# Patient Record
Sex: Female | Born: 1945 | Race: Black or African American | Hispanic: No | Smoking: Former smoker
Health system: Southern US, Community
[De-identification: ages and names within clinical notes are randomized; demographics above are authoritative.]

## PROBLEM LIST (undated history)

## (undated) DIAGNOSIS — G4730 Sleep apnea, unspecified: Secondary | ICD-10-CM

## (undated) DIAGNOSIS — E1140 Type 2 diabetes mellitus with diabetic neuropathy, unspecified: Secondary | ICD-10-CM

## (undated) DIAGNOSIS — M1990 Unspecified osteoarthritis, unspecified site: Secondary | ICD-10-CM

## (undated) DIAGNOSIS — K219 Gastro-esophageal reflux disease without esophagitis: Secondary | ICD-10-CM

## (undated) DIAGNOSIS — E7800 Pure hypercholesterolemia, unspecified: Secondary | ICD-10-CM

## (undated) DIAGNOSIS — D649 Anemia, unspecified: Secondary | ICD-10-CM

## (undated) DIAGNOSIS — N189 Chronic kidney disease, unspecified: Secondary | ICD-10-CM

## (undated) DIAGNOSIS — Z9289 Personal history of other medical treatment: Secondary | ICD-10-CM

## (undated) DIAGNOSIS — I10 Essential (primary) hypertension: Secondary | ICD-10-CM

## (undated) DIAGNOSIS — I509 Heart failure, unspecified: Secondary | ICD-10-CM

## (undated) DIAGNOSIS — IMO0001 Reserved for inherently not codable concepts without codable children: Secondary | ICD-10-CM

## (undated) HISTORY — PX: EYE SURGERY: SHX253

## (undated) HISTORY — DX: Sleep apnea, unspecified: G47.30

## (undated) HISTORY — DX: Unspecified osteoarthritis, unspecified site: M19.90

## (undated) HISTORY — PX: COLONOSCOPY: SHX174

## (undated) HISTORY — DX: Essential (primary) hypertension: I10

---

## 2000-12-31 HISTORY — PX: TOTAL SHOULDER REPLACEMENT: SUR1217

## 2004-12-31 ENCOUNTER — Encounter (INDEPENDENT_AMBULATORY_CARE_PROVIDER_SITE_OTHER): Payer: Self-pay | Admitting: Nurse Practitioner

## 2005-07-24 ENCOUNTER — Other Ambulatory Visit: Admission: RE | Admit: 2005-07-24 | Discharge: 2005-07-24 | Payer: Self-pay | Admitting: Obstetrics and Gynecology

## 2005-08-27 ENCOUNTER — Emergency Department (HOSPITAL_COMMUNITY): Admission: EM | Admit: 2005-08-27 | Discharge: 2005-08-27 | Payer: Self-pay | Admitting: Emergency Medicine

## 2007-07-23 ENCOUNTER — Encounter (INDEPENDENT_AMBULATORY_CARE_PROVIDER_SITE_OTHER): Payer: Self-pay | Admitting: Nurse Practitioner

## 2007-07-23 ENCOUNTER — Ambulatory Visit: Payer: Self-pay | Admitting: Internal Medicine

## 2007-07-23 ENCOUNTER — Ambulatory Visit: Payer: Self-pay | Admitting: *Deleted

## 2007-07-23 LAB — CONVERTED CEMR LAB
ALT: 11 units/L (ref 0–35)
Albumin: 4 g/dL (ref 3.5–5.2)
Alkaline Phosphatase: 129 units/L — ABNORMAL HIGH (ref 39–117)
CO2: 25 meq/L (ref 19–32)
Glucose, Bld: 464 mg/dL — ABNORMAL HIGH (ref 70–99)
Hgb A1c MFr Bld: >14 %
Lymphocytes Relative: 39 % (ref 12–46)
Lymphs Abs: 3 10*3/uL (ref 0.7–3.3)
Neutrophils Relative %: 52 % (ref 43–77)
Platelets: 330 10*3/uL (ref 150–400)
Potassium: 4.2 meq/L (ref 3.5–5.3)
Sodium: 138 meq/L (ref 135–145)
TSH: 1.042 microintl units/mL (ref 0.350–5.50)
Total Protein: 7.5 g/dL (ref 6.0–8.3)
WBC: 7.8 10*3/uL (ref 4.0–10.5)

## 2007-07-24 ENCOUNTER — Encounter (INDEPENDENT_AMBULATORY_CARE_PROVIDER_SITE_OTHER): Payer: Self-pay | Admitting: Nurse Practitioner

## 2007-07-24 DIAGNOSIS — R809 Proteinuria, unspecified: Secondary | ICD-10-CM | POA: Insufficient documentation

## 2007-07-24 LAB — CONVERTED CEMR LAB
RBC count: 5.26 10*6/uL
TSH: 1.042 microintl units/mL

## 2007-07-25 ENCOUNTER — Ambulatory Visit: Payer: Self-pay | Admitting: Family Medicine

## 2007-08-08 ENCOUNTER — Ambulatory Visit: Payer: Self-pay | Admitting: Family Medicine

## 2007-08-20 ENCOUNTER — Ambulatory Visit: Payer: Self-pay | Admitting: Family Medicine

## 2007-08-25 ENCOUNTER — Encounter: Payer: Self-pay | Admitting: Nurse Practitioner

## 2007-08-25 DIAGNOSIS — G2581 Restless legs syndrome: Secondary | ICD-10-CM

## 2007-08-25 DIAGNOSIS — I10 Essential (primary) hypertension: Secondary | ICD-10-CM | POA: Insufficient documentation

## 2007-08-25 DIAGNOSIS — E119 Type 2 diabetes mellitus without complications: Secondary | ICD-10-CM

## 2007-09-03 ENCOUNTER — Ambulatory Visit: Payer: Self-pay | Admitting: Internal Medicine

## 2007-09-18 ENCOUNTER — Ambulatory Visit: Payer: Self-pay | Admitting: Internal Medicine

## 2007-09-19 ENCOUNTER — Ambulatory Visit (HOSPITAL_COMMUNITY): Admission: RE | Admit: 2007-09-19 | Discharge: 2007-09-19 | Payer: Self-pay | Admitting: Family Medicine

## 2007-10-02 ENCOUNTER — Ambulatory Visit: Payer: Self-pay | Admitting: Internal Medicine

## 2007-11-03 ENCOUNTER — Ambulatory Visit: Payer: Self-pay | Admitting: Family Medicine

## 2008-04-29 ENCOUNTER — Ambulatory Visit: Payer: Self-pay | Admitting: Internal Medicine

## 2008-09-21 ENCOUNTER — Ambulatory Visit (HOSPITAL_COMMUNITY): Admission: RE | Admit: 2008-09-21 | Discharge: 2008-09-21 | Payer: Self-pay | Admitting: Family Medicine

## 2009-09-22 ENCOUNTER — Encounter: Admission: RE | Admit: 2009-09-22 | Discharge: 2009-09-22 | Payer: Self-pay | Admitting: Cardiology

## 2009-09-22 ENCOUNTER — Ambulatory Visit (HOSPITAL_COMMUNITY): Admission: RE | Admit: 2009-09-22 | Discharge: 2009-09-22 | Payer: Self-pay | Admitting: Cardiology

## 2009-12-28 ENCOUNTER — Ambulatory Visit: Payer: Self-pay | Admitting: Obstetrics and Gynecology

## 2010-04-24 ENCOUNTER — Emergency Department (HOSPITAL_COMMUNITY): Admission: EM | Admit: 2010-04-24 | Discharge: 2010-04-25 | Payer: Self-pay | Admitting: Emergency Medicine

## 2010-04-25 ENCOUNTER — Emergency Department (HOSPITAL_COMMUNITY): Admission: EM | Admit: 2010-04-25 | Discharge: 2010-04-25 | Payer: Self-pay | Admitting: Emergency Medicine

## 2010-09-25 ENCOUNTER — Ambulatory Visit (HOSPITAL_COMMUNITY): Admission: RE | Admit: 2010-09-25 | Discharge: 2010-09-25 | Payer: Self-pay | Admitting: Obstetrics & Gynecology

## 2010-10-14 ENCOUNTER — Emergency Department (HOSPITAL_COMMUNITY): Admission: EM | Admit: 2010-10-14 | Discharge: 2010-10-14 | Payer: Self-pay | Admitting: Emergency Medicine

## 2011-02-13 ENCOUNTER — Ambulatory Visit: Payer: Self-pay | Admitting: Obstetrics and Gynecology

## 2011-02-27 ENCOUNTER — Ambulatory Visit: Payer: Self-pay | Admitting: Obstetrics and Gynecology

## 2011-03-14 LAB — URINALYSIS, ROUTINE W REFLEX MICROSCOPIC
Bilirubin Urine: NEGATIVE
Bilirubin Urine: NEGATIVE
Glucose, UA: >1000 mg/dL — AB
Ketones, ur: NEGATIVE mg/dL
Nitrite: NEGATIVE
Protein, ur: 100 mg/dL — AB
Protein, ur: >300 mg/dL — AB
Specific Gravity, Urine: 1.012 (ref 1.005–1.030)
Urobilinogen, UA: 0.2 mg/dL (ref 0.0–1.0)
Urobilinogen, UA: 1 mg/dL (ref 0.0–1.0)

## 2011-03-14 LAB — URINE CULTURE: Colony Count: >=100000

## 2011-03-14 LAB — BASIC METABOLIC PANEL
CO2: 28 mEq/L (ref 19–32)
Chloride: 96 mEq/L (ref 96–112)
GFR calc Af Amer: >60 mL/min (ref >60)
Glucose, Bld: 305 mg/dL — ABNORMAL HIGH (ref 70–99)
Potassium: 3.8 mEq/L (ref 3.5–5.1)
Sodium: 136 mEq/L (ref 135–145)

## 2011-03-14 LAB — DIFFERENTIAL
Basophils Relative: 0 % (ref 0–1)
Eosinophils Absolute: 0.1 10*3/uL (ref 0.0–0.7)
Lymphs Abs: 3 10*3/uL (ref 0.7–4.0)
Monocytes Absolute: 0.8 10*3/uL (ref 0.1–1.0)
Monocytes Relative: 10 % (ref 3–12)

## 2011-03-14 LAB — URINE MICROSCOPIC-ADD ON

## 2011-03-14 LAB — CBC
HCT: 39.6 % (ref 36.0–46.0)
Hemoglobin: 13.3 g/dL (ref 12.0–15.0)
MCH: 29 pg (ref 26.0–34.0)
MCHC: 33.7 g/dL (ref 30.0–36.0)
MCV: 86.1 fL (ref 78.0–100.0)
RBC: 4.6 MIL/uL (ref 3.87–5.11)

## 2011-03-19 ENCOUNTER — Ambulatory Visit: Payer: Medicare Other | Admitting: Occupational Therapy

## 2011-03-19 ENCOUNTER — Encounter: Payer: Self-pay | Admitting: Obstetrics & Gynecology

## 2011-03-19 DIAGNOSIS — Z01419 Encounter for gynecological examination (general) (routine) without abnormal findings: Secondary | ICD-10-CM

## 2011-03-19 LAB — CONVERTED CEMR LAB
Chlamydia, DNA Probe: NEGATIVE
GC Probe Amp, Genital: NEGATIVE
Trich, Wet Prep: NONE SEEN
Yeast Wet Prep HPF POC: NONE SEEN

## 2011-03-21 ENCOUNTER — Encounter (INDEPENDENT_AMBULATORY_CARE_PROVIDER_SITE_OTHER): Payer: Self-pay | Admitting: *Deleted

## 2011-03-23 NOTE — Progress Notes (Signed)
NAMEDHANI, IMEL                 ACCOUNT NO.:  [id number]  MEDICAL RECORD NO.:  [id number]           PATIENT TYPE:  A  LOCATION:  [facility]                   FACILITY:  [facility]  PHYSICIAN:  Maylon Cos, CNM    DATE OF BIRTH:  [birth year]  DATE OF SERVICE:  03/19/2011                                 CLINIC NOTE  CHIEF COMPLAINT:  Annual examination.  HISTORY OF PRESENT ILLNESS:  Ms. Wise is a 66 year old G5, P5 postmenopausal female who is here today for her annual exam.  She is about 15 years postmenopausal.  Her last Pap smear was on December 28, 2009, with our clinic.  She denies any history of abnormal Pap smears. She denies any vaginal bleeding, discharge, or other gynecologic concerns at this time.  She has annual mammograms.  Her last was in September 2011, which was negative.  She states that she had a colonoscopy years ago, but she thinks she is due and requests referral today.  She has a primary care doctor, Dr. Shana Chute who follows her for her chronic medical problems.  She is on multiple medications and states that she has not had her medications this morning.  She has one concern today, which is a lump that she has noticed in her abdomen that she states she would like to have checked out.  OB/GYN HISTORY:  Menopausal, 5 vaginal deliveries.  No history of abnormal Pap smear.  PAST MEDICAL HISTORY: 1. Hypertension. 2. High cholesterol. 3. Diabetes.  PAST SURGICAL HISTORY:  Bilateral shoulder replacement.  MEDICATIONS:  See med list.  ALLERGIES:  PENICILLIN.  SOCIAL HISTORY:  The patient is married.  Unemployed.  No smoking, alcohol, or drug use.  FAMILY HISTORY:  Positive for diabetes, heart disease, high blood pressure.  No gynecologic cancers or any other cancers.  REVIEW OF SYSTEMS:  Negative except as noted in the HPI.  PHYSICAL EXAMINATION:  VITAL SIGNS:  Temperature is 97.6, pulse is 93, blood pressure 195/90, weight 199.6, height 60  inches. GENERAL:  Well-appearing obese female in no acute distress. HEAD:  Normocephalic and atraumatic. BREASTS:  Symmetric in size.  Nontender.  No masses.  No skin changes. No nipple drainage.  No lymphadenopathy. ABDOMEN:  Obese, nontender, slightly distended.  There is a palpable approximately 2-3 cm mobile mass in the lower right quadrant, nontender. PELVIC:  Normal external genitalia.  Atrophic vaginal tissue and no abnormal lesions or abnormal discharge noted.  Nonenlarged, nontender uterus.  Adnexa were not able to be palpated.  ASSESSMENT/PLAN:  The patient is a 66 year old G5, P5 postmenopausal here for annual exam.  Pap smear was deferred today due to history of abnormal paps and last Pap 2 years ago.  The patient requested screening for vaginal infections today; so gonorrhea and Chlamydia and wet prep were collected.  The patient will be contacted to make a referral for a colonoscopy.  She plans to schedule her mammogram for later this year. She will follow up with here primary care physician regarding the abdominal mass that she was concerned about.  She will follow up with her PCP as well as other chronic medical issues.  She is going to take her medication as soon as she leaves here due to her elevated blood pressure and she will return to [place] for annual exam for any gynecologic concerns.  We discussed the Pap smear guidelines and the patient plans to return next year for annual Pap smear and at that time we will decide if she wants to continue with Pap screenings.          ______________________________ Maylon Cos, CNM    SS/MEDQ  D:  03/19/2011  T:  03/20/2011  Job:  [id number]

## 2011-03-29 NOTE — Letter (Signed)
Summary: New Patient letter  [facility]  [address] [place], [place] [address]   Phone: [phone]  Fax: [phone]       03/21/2011 MRN: [id number]  [name] [address] [place], [place]  [address]  [place]  Dear Ms. Cheree Ditto,  Welcome to the Gastroenterology Division at [facility].    You are scheduled to see Dr.  Christella Hartigan on 04-30-11 at 2:30P.M. on the 3rd floor at [facility], [address].  We ask that you try to arrive at our office 15 minutes prior to your appointment time to allow for check-in.  We would like you to complete the enclosed self-administered evaluation form prior to your visit and bring it with you on the day of your appointment.  We will review it with you.  Also, please bring a complete list of all your medications or, if you prefer, bring the medication bottles and we will list them.  Please bring your insurance card so that we may make a copy of it.  If your insurance requires a referral to see a specialist, please bring your referral form from your primary care physician.  Co-payments are due at the time of your visit and may be paid by cash, check or credit card.     Your office visit will consist of a consult with your physician (includes a physical exam), any laboratory testing he/she may order, scheduling of any necessary diagnostic testing (e.g. x-ray, ultrasound, CT-scan), and scheduling of a procedure (e.g. Endoscopy, Colonoscopy) if required.  Please allow enough time on your schedule to allow for any/all of these possibilities.    If you cannot keep your appointment, please call [phone] to cancel or reschedule prior to your appointment date.  This allows [place] the opportunity to schedule an appointment for another patient in need of care.  If you do not cancel or reschedule by 5 p.m. the business day prior to your appointment date, you will be charged a $50.00 late cancellation/no-show fee.    Thank you for  choosing [facility] for your medical needs.  We appreciate the opportunity to care for you.  Please visit [place] at our website  to learn more about our practice.                     Sincerely,                                                             The Gastroenterology Division

## 2011-04-03 ENCOUNTER — Other Ambulatory Visit: Payer: Self-pay | Admitting: Cardiology

## 2011-04-03 DIAGNOSIS — R1900 Intra-abdominal and pelvic swelling, mass and lump, unspecified site: Secondary | ICD-10-CM

## 2011-04-06 ENCOUNTER — Ambulatory Visit
Admission: RE | Admit: 2011-04-06 | Discharge: 2011-04-06 | Disposition: A | Discharge status: Home / Self Care | Payer: Medicare Other | Source: Ambulatory Visit | Attending: Cardiology | Admitting: Cardiology

## 2011-04-06 DIAGNOSIS — R1900 Intra-abdominal and pelvic swelling, mass and lump, unspecified site: Secondary | ICD-10-CM

## 2011-04-06 MED ORDER — IOHEXOL 300 MG/ML  SOLN
125.0000 mL | Freq: Once | INTRAMUSCULAR | Status: AC | PRN
  Administered 2011-04-06: 125 mL via INTRAVENOUS

## 2011-04-30 ENCOUNTER — Ambulatory Visit (INDEPENDENT_AMBULATORY_CARE_PROVIDER_SITE_OTHER): Payer: Medicare Other | Admitting: Gastroenterology

## 2011-04-30 ENCOUNTER — Encounter: Payer: Self-pay | Admitting: Gastroenterology

## 2011-04-30 VITALS — BP 132/76 | HR 92 | Ht 60.0 in | Wt 197.0 lb

## 2011-04-30 DIAGNOSIS — Z1211 Encounter for screening for malignant neoplasm of colon: Secondary | ICD-10-CM

## 2011-04-30 MED ORDER — PEG-KCL-NACL-NASULF-NA ASC-C 100 G PO SOLR: 1.0000 | ORAL | Status: DC

## 2011-04-30 NOTE — Patient Instructions (Signed)
You will be set up for a colonoscopy with propofol (pt preference for sedation). A copy of this information will be made available to Dr. Delane Ginger,

## 2011-04-30 NOTE — Progress Notes (Signed)
HPI: This is a  very pleasant 66 year old woman.  She had a colonoscopy 2 of them in [place]. Went well.   Most recent was 10-12 years ago. She does not think anything was ever found (no polyps, no cancers).  She has intermittent mild constipation, no bleeding.  No abd pains.  Overall stable weight, fluctuates.  Thought to have a mass in right abd, however a  CT scan recently showed nothing like that.    Review of systems: Pertinent positive and negative review of systems were noted in the above HPI section.  All other review of systems was otherwise negative.   Past Medical History, Past Surgical History, Family History, Social History, Current Medications, Allergies were all reviewed with the patient via [facility] electronic medical record system.   Physical Exam: BP 132/76  Pulse 92  Ht 5' (1.524 m)  Wt 197 lb (89.359 kg)  BMI 38.47 kg/m2  Constitutional: generally well-appearing Psychiatric: alert and oriented x3 Eyes: extraocular movements intact Mouth: oral pharynx moist, no lesions Neck: supple no lymphadenopathy Cardiovascular: heart regular rate and rhythm Lungs: clear to auscultation bilaterally Abdomen: soft, nontender, nondistended, no obvious ascites, no peritoneal signs, normal bowel sounds Extremities: no lower extremity edema bilaterally Skin: no lesions on visible extremities    Assessment and plan: 66 y.o. Female at routine risk for colon cancer  She has not had colon cancer screening in about 10-12 years and so we will set her up with colonoscopy at her soonest convenience. She greatly prefers this to be done with more complete anesthesia then moderate sedation. She was fairly uncomfortable during previous moderate sedation colonoscopy in [place]. We will therefore set this up with propofol.

## 2011-05-31 ENCOUNTER — Other Ambulatory Visit: Payer: Medicare Other | Admitting: Gastroenterology

## 2011-05-31 ENCOUNTER — Other Ambulatory Visit: Payer: Self-pay | Admitting: Gastroenterology

## 2011-05-31 ENCOUNTER — Ambulatory Visit (HOSPITAL_COMMUNITY)
Admission: RE | Admit: 2011-05-31 | Discharge: 2011-05-31 | Disposition: A | Discharge status: Home / Self Care | Payer: Medicare Other | Source: Ambulatory Visit | Attending: Gastroenterology | Admitting: Gastroenterology

## 2011-05-31 DIAGNOSIS — Z1211 Encounter for screening for malignant neoplasm of colon: Secondary | ICD-10-CM | POA: Insufficient documentation

## 2011-05-31 DIAGNOSIS — E119 Type 2 diabetes mellitus without complications: Secondary | ICD-10-CM | POA: Insufficient documentation

## 2011-05-31 DIAGNOSIS — K644 Residual hemorrhoidal skin tags: Secondary | ICD-10-CM | POA: Insufficient documentation

## 2011-05-31 DIAGNOSIS — Z79899 Other long term (current) drug therapy: Secondary | ICD-10-CM | POA: Insufficient documentation

## 2011-05-31 DIAGNOSIS — I10 Essential (primary) hypertension: Secondary | ICD-10-CM | POA: Insufficient documentation

## 2011-05-31 DIAGNOSIS — K648 Other hemorrhoids: Secondary | ICD-10-CM | POA: Insufficient documentation

## 2011-05-31 DIAGNOSIS — D126 Benign neoplasm of colon, unspecified: Secondary | ICD-10-CM

## 2011-05-31 LAB — GLUCOSE, CAPILLARY: Glucose-Capillary: 292 mg/dL — ABNORMAL HIGH (ref 70–99)

## 2011-06-10 ENCOUNTER — Emergency Department (HOSPITAL_COMMUNITY): Payer: Medicare Other

## 2011-06-10 ENCOUNTER — Emergency Department (HOSPITAL_COMMUNITY)
Admission: EM | Admit: 2011-06-10 | Discharge: 2011-06-10 | Disposition: A | Discharge status: Home / Self Care | Payer: Medicare Other | Attending: Emergency Medicine | Admitting: Emergency Medicine

## 2011-06-10 DIAGNOSIS — R109 Unspecified abdominal pain: Secondary | ICD-10-CM | POA: Insufficient documentation

## 2011-06-10 DIAGNOSIS — Z79899 Other long term (current) drug therapy: Secondary | ICD-10-CM | POA: Insufficient documentation

## 2011-06-10 DIAGNOSIS — E119 Type 2 diabetes mellitus without complications: Secondary | ICD-10-CM | POA: Insufficient documentation

## 2011-06-10 DIAGNOSIS — Z7982 Long term (current) use of aspirin: Secondary | ICD-10-CM | POA: Insufficient documentation

## 2011-06-10 DIAGNOSIS — H409 Unspecified glaucoma: Secondary | ICD-10-CM | POA: Insufficient documentation

## 2011-06-10 DIAGNOSIS — I10 Essential (primary) hypertension: Secondary | ICD-10-CM | POA: Insufficient documentation

## 2011-06-10 LAB — COMPREHENSIVE METABOLIC PANEL
ALT: 14 U/L (ref 0–35)
Alkaline Phosphatase: 84 U/L (ref 39–117)
CO2: 25 mEq/L (ref 19–32)
Calcium: 9.9 mg/dL (ref 8.4–10.5)
Chloride: 99 mEq/L (ref 96–112)
GFR calc Af Amer: >60 mL/min (ref >60)
GFR calc non Af Amer: >60 mL/min (ref >60)
Glucose, Bld: 281 mg/dL — ABNORMAL HIGH (ref 70–99)
Sodium: 138 mEq/L (ref 135–145)
Total Bilirubin: 0.2 mg/dL — ABNORMAL LOW (ref 0.3–1.2)

## 2011-06-10 LAB — DIFFERENTIAL
Basophils Relative: 0 % (ref 0–1)
Lymphs Abs: 3.7 10*3/uL (ref 0.7–4.0)
Monocytes Relative: 8 % (ref 3–12)
Neutro Abs: 3.2 10*3/uL (ref 1.7–7.7)
Neutrophils Relative %: 42 % — ABNORMAL LOW (ref 43–77)

## 2011-06-10 LAB — CBC
Hemoglobin: 12.4 g/dL (ref 12.0–15.0)
MCH: 28.4 pg (ref 26.0–34.0)
RBC: 4.36 MIL/uL (ref 3.87–5.11)
WBC: 7.6 10*3/uL (ref 4.0–10.5)

## 2011-06-10 LAB — URINALYSIS, ROUTINE W REFLEX MICROSCOPIC
Bilirubin Urine: NEGATIVE
Glucose, UA: 500 mg/dL — AB
Ketones, ur: NEGATIVE mg/dL
Protein, ur: >300 mg/dL — AB
Urobilinogen, UA: 0.2 mg/dL (ref 0.0–1.0)

## 2011-06-10 LAB — URINE MICROSCOPIC-ADD ON

## 2011-06-10 MED ORDER — IOHEXOL 300 MG/ML  SOLN: 100.0000 mL | Freq: Once | INTRAMUSCULAR | Status: DC | PRN

## 2011-06-11 LAB — URINE CULTURE
Colony Count: 7000
Culture  Setup Time: 201206102033

## 2011-07-23 ENCOUNTER — Other Ambulatory Visit (HOSPITAL_COMMUNITY): Payer: Self-pay | Admitting: Cardiology

## 2011-07-23 DIAGNOSIS — Z1231 Encounter for screening mammogram for malignant neoplasm of breast: Secondary | ICD-10-CM

## 2011-08-17 ENCOUNTER — Emergency Department (HOSPITAL_COMMUNITY)
Admission: EM | Admit: 2011-08-17 | Discharge: 2011-08-17 | Disposition: A | Discharge status: Home / Self Care | Payer: Medicare Other | Attending: Emergency Medicine | Admitting: Emergency Medicine

## 2011-08-17 ENCOUNTER — Emergency Department (HOSPITAL_COMMUNITY): Payer: Medicare Other

## 2011-08-17 DIAGNOSIS — M2560 Stiffness of unspecified joint, not elsewhere classified: Secondary | ICD-10-CM | POA: Insufficient documentation

## 2011-08-17 DIAGNOSIS — M545 Low back pain, unspecified: Secondary | ICD-10-CM | POA: Insufficient documentation

## 2011-08-17 DIAGNOSIS — E119 Type 2 diabetes mellitus without complications: Secondary | ICD-10-CM | POA: Insufficient documentation

## 2011-08-17 DIAGNOSIS — Z79899 Other long term (current) drug therapy: Secondary | ICD-10-CM | POA: Insufficient documentation

## 2011-08-17 DIAGNOSIS — M542 Cervicalgia: Secondary | ICD-10-CM | POA: Insufficient documentation

## 2011-08-17 DIAGNOSIS — S139XXA Sprain of joints and ligaments of unspecified parts of neck, initial encounter: Secondary | ICD-10-CM | POA: Insufficient documentation

## 2011-08-17 DIAGNOSIS — I10 Essential (primary) hypertension: Secondary | ICD-10-CM | POA: Insufficient documentation

## 2011-08-17 DIAGNOSIS — S335XXA Sprain of ligaments of lumbar spine, initial encounter: Secondary | ICD-10-CM | POA: Insufficient documentation

## 2011-08-17 DIAGNOSIS — M79609 Pain in unspecified limb: Secondary | ICD-10-CM | POA: Insufficient documentation

## 2011-09-27 ENCOUNTER — Ambulatory Visit (HOSPITAL_COMMUNITY)
Admission: RE | Admit: 2011-09-27 | Discharge: 2011-09-27 | Disposition: A | Discharge status: Home / Self Care | Payer: Medicare Other | Source: Ambulatory Visit | Attending: Cardiology | Admitting: Cardiology

## 2011-09-27 ENCOUNTER — Ambulatory Visit (HOSPITAL_COMMUNITY): Payer: Medicare Other

## 2011-09-27 DIAGNOSIS — Z1231 Encounter for screening mammogram for malignant neoplasm of breast: Secondary | ICD-10-CM

## 2011-12-17 ENCOUNTER — Other Ambulatory Visit: Payer: Self-pay | Admitting: Cardiology

## 2012-01-31 ENCOUNTER — Other Ambulatory Visit: Payer: Self-pay | Admitting: Cardiology

## 2012-03-17 ENCOUNTER — Other Ambulatory Visit: Payer: Self-pay | Admitting: Cardiology

## 2012-06-03 ENCOUNTER — Other Ambulatory Visit: Payer: Self-pay | Admitting: Cardiology

## 2012-06-17 ENCOUNTER — Other Ambulatory Visit: Payer: Self-pay | Admitting: Cardiology

## 2012-09-08 ENCOUNTER — Other Ambulatory Visit: Payer: Self-pay | Admitting: Cardiology

## 2012-09-30 ENCOUNTER — Other Ambulatory Visit (HOSPITAL_COMMUNITY): Payer: Self-pay | Admitting: Cardiology

## 2012-09-30 DIAGNOSIS — Z1231 Encounter for screening mammogram for malignant neoplasm of breast: Secondary | ICD-10-CM

## 2012-10-21 ENCOUNTER — Ambulatory Visit (HOSPITAL_COMMUNITY)
Admission: RE | Admit: 2012-10-21 | Discharge: 2012-10-21 | Disposition: A | Discharge status: Home / Self Care | Payer: Medicare Other | Source: Ambulatory Visit | Attending: Cardiology | Admitting: Cardiology

## 2012-10-21 DIAGNOSIS — Z1231 Encounter for screening mammogram for malignant neoplasm of breast: Secondary | ICD-10-CM | POA: Insufficient documentation

## 2013-10-23 ENCOUNTER — Inpatient Hospital Stay (HOSPITAL_COMMUNITY)
Admission: EM | Admit: 2013-10-23 | Discharge: 2013-10-27 | DRG: 291 | Disposition: A | Discharge status: Home / Self Care | Payer: Medicare Other | Attending: Cardiology | Admitting: Cardiology

## 2013-10-23 ENCOUNTER — Inpatient Hospital Stay (HOSPITAL_COMMUNITY): Payer: Medicare Other

## 2013-10-23 ENCOUNTER — Emergency Department (HOSPITAL_COMMUNITY): Payer: Medicare Other

## 2013-10-23 ENCOUNTER — Encounter (HOSPITAL_COMMUNITY): Payer: Self-pay | Admitting: Emergency Medicine

## 2013-10-23 DIAGNOSIS — D509 Iron deficiency anemia, unspecified: Secondary | ICD-10-CM | POA: Diagnosis present

## 2013-10-23 DIAGNOSIS — Z87891 Personal history of nicotine dependence: Secondary | ICD-10-CM

## 2013-10-23 DIAGNOSIS — N184 Chronic kidney disease, stage 4 (severe): Secondary | ICD-10-CM | POA: Diagnosis present

## 2013-10-23 DIAGNOSIS — Z8249 Family history of ischemic heart disease and other diseases of the circulatory system: Secondary | ICD-10-CM

## 2013-10-23 DIAGNOSIS — E1142 Type 2 diabetes mellitus with diabetic polyneuropathy: Secondary | ICD-10-CM | POA: Diagnosis present

## 2013-10-23 DIAGNOSIS — I5023 Acute on chronic systolic (congestive) heart failure: Principal | ICD-10-CM | POA: Diagnosis present

## 2013-10-23 DIAGNOSIS — I319 Disease of pericardium, unspecified: Secondary | ICD-10-CM | POA: Diagnosis present

## 2013-10-23 DIAGNOSIS — A498 Other bacterial infections of unspecified site: Secondary | ICD-10-CM | POA: Diagnosis present

## 2013-10-23 DIAGNOSIS — Z880 Allergy status to penicillin: Secondary | ICD-10-CM

## 2013-10-23 DIAGNOSIS — Z79899 Other long term (current) drug therapy: Secondary | ICD-10-CM

## 2013-10-23 DIAGNOSIS — R0603 Acute respiratory distress: Secondary | ICD-10-CM

## 2013-10-23 DIAGNOSIS — E1149 Type 2 diabetes mellitus with other diabetic neurological complication: Secondary | ICD-10-CM | POA: Diagnosis present

## 2013-10-23 DIAGNOSIS — Z23 Encounter for immunization: Secondary | ICD-10-CM

## 2013-10-23 DIAGNOSIS — Z96619 Presence of unspecified artificial shoulder joint: Secondary | ICD-10-CM

## 2013-10-23 DIAGNOSIS — I129 Hypertensive chronic kidney disease with stage 1 through stage 4 chronic kidney disease, or unspecified chronic kidney disease: Secondary | ICD-10-CM | POA: Diagnosis present

## 2013-10-23 DIAGNOSIS — N179 Acute kidney failure, unspecified: Secondary | ICD-10-CM | POA: Diagnosis present

## 2013-10-23 DIAGNOSIS — N390 Urinary tract infection, site not specified: Secondary | ICD-10-CM | POA: Diagnosis present

## 2013-10-23 DIAGNOSIS — G4733 Obstructive sleep apnea (adult) (pediatric): Secondary | ICD-10-CM | POA: Diagnosis present

## 2013-10-23 DIAGNOSIS — J810 Acute pulmonary edema: Secondary | ICD-10-CM | POA: Diagnosis present

## 2013-10-23 DIAGNOSIS — N058 Unspecified nephritic syndrome with other morphologic changes: Secondary | ICD-10-CM | POA: Diagnosis present

## 2013-10-23 DIAGNOSIS — Z6838 Body mass index (BMI) 38.0-38.9, adult: Secondary | ICD-10-CM

## 2013-10-23 DIAGNOSIS — E872 Acidosis, unspecified: Secondary | ICD-10-CM | POA: Diagnosis present

## 2013-10-23 DIAGNOSIS — I10 Essential (primary) hypertension: Secondary | ICD-10-CM | POA: Diagnosis present

## 2013-10-23 DIAGNOSIS — J9600 Acute respiratory failure, unspecified whether with hypoxia or hypercapnia: Secondary | ICD-10-CM | POA: Diagnosis present

## 2013-10-23 DIAGNOSIS — E7800 Pure hypercholesterolemia, unspecified: Secondary | ICD-10-CM | POA: Diagnosis present

## 2013-10-23 DIAGNOSIS — E1129 Type 2 diabetes mellitus with other diabetic kidney complication: Secondary | ICD-10-CM | POA: Diagnosis present

## 2013-10-23 DIAGNOSIS — I509 Heart failure, unspecified: Secondary | ICD-10-CM | POA: Diagnosis present

## 2013-10-23 DIAGNOSIS — Z7982 Long term (current) use of aspirin: Secondary | ICD-10-CM

## 2013-10-23 LAB — COMPREHENSIVE METABOLIC PANEL
ALT: 25 U/L (ref 0–35)
BUN: 43 mg/dL — ABNORMAL HIGH (ref 6–23)
CO2: 28 mEq/L (ref 19–32)
Chloride: 103 mEq/L (ref 96–112)
Creatinine, Ser: 2.85 mg/dL — ABNORMAL HIGH (ref 0.50–1.10)
GFR calc non Af Amer: 16 mL/min — ABNORMAL LOW (ref >90)
Glucose, Bld: 254 mg/dL — ABNORMAL HIGH (ref 70–99)
Potassium: 4.5 mEq/L (ref 3.5–5.1)
Sodium: 143 mEq/L (ref 135–145)
Total Bilirubin: 0.2 mg/dL — ABNORMAL LOW (ref 0.3–1.2)
Total Protein: 7.1 g/dL (ref 6.0–8.3)

## 2013-10-23 LAB — POCT I-STAT, CHEM 8
BUN: 55 mg/dL — ABNORMAL HIGH (ref 6–23)
Creatinine, Ser: 3 mg/dL — ABNORMAL HIGH (ref 0.50–1.10)
Glucose, Bld: 335 mg/dL — ABNORMAL HIGH (ref 70–99)
Sodium: 139 mEq/L (ref 135–145)
TCO2: 23 mmol/L (ref 0–100)

## 2013-10-23 LAB — HEMOGLOBIN A1C
Hgb A1c MFr Bld: 6.4 % — ABNORMAL HIGH (ref <5.7)
Mean Plasma Glucose: 137 mg/dL — ABNORMAL HIGH (ref <117)

## 2013-10-23 LAB — CBC WITH DIFFERENTIAL/PLATELET
HCT: 28.5 % — ABNORMAL LOW (ref 36.0–46.0)
Hemoglobin: 9.7 g/dL — ABNORMAL LOW (ref 12.0–15.0)
Lymphocytes Relative: 11 % — ABNORMAL LOW (ref 12–46)
Lymphs Abs: 0.9 10*3/uL (ref 0.7–4.0)
MCHC: 34 g/dL (ref 30.0–36.0)
Monocytes Absolute: 0.4 10*3/uL (ref 0.1–1.0)
Monocytes Relative: 5 % (ref 3–12)
Neutro Abs: 7.3 10*3/uL (ref 1.7–7.7)
Neutrophils Relative %: 84 % — ABNORMAL HIGH (ref 43–77)
RBC: 3.19 MIL/uL — ABNORMAL LOW (ref 3.87–5.11)
WBC: 8.6 10*3/uL (ref 4.0–10.5)

## 2013-10-23 LAB — CBC
HCT: 31.9 % — ABNORMAL LOW (ref 36.0–46.0)
Hemoglobin: 10.9 g/dL — ABNORMAL LOW (ref 12.0–15.0)
MCH: 30.3 pg (ref 26.0–34.0)
MCV: 88.6 fL (ref 78.0–100.0)
Platelets: 439 10*3/uL — ABNORMAL HIGH (ref 150–400)
RBC: 3.6 MIL/uL — ABNORMAL LOW (ref 3.87–5.11)
WBC: 12.3 10*3/uL — ABNORMAL HIGH (ref 4.0–10.5)

## 2013-10-23 LAB — GLUCOSE, CAPILLARY
Glucose-Capillary: 336 mg/dL — ABNORMAL HIGH (ref 70–99)
Glucose-Capillary: 149 mg/dL — ABNORMAL HIGH (ref 70–99)

## 2013-10-23 LAB — POCT I-STAT 3, ART BLOOD GAS (G3+)
Acid-base deficit: 3 mmol/L — ABNORMAL HIGH (ref 0.0–2.0)
Bicarbonate: 24.3 mEq/L — ABNORMAL HIGH (ref 20.0–24.0)
O2 Saturation: 99 %
TCO2: 26 mmol/L (ref 0–100)
pO2, Arterial: 161 mmHg — ABNORMAL HIGH (ref 80.0–100.0)

## 2013-10-23 LAB — URINE MICROSCOPIC-ADD ON

## 2013-10-23 LAB — POCT I-STAT TROPONIN I: Troponin i, poc: 0 ng/mL (ref 0.00–0.08)

## 2013-10-23 LAB — CREATININE, URINE, RANDOM: Creatinine, Urine: 23.31 mg/dL

## 2013-10-23 LAB — APTT: aPTT: 35 seconds (ref 24–37)

## 2013-10-23 LAB — MRSA PCR SCREENING: MRSA by PCR: NEGATIVE

## 2013-10-23 LAB — URINALYSIS, ROUTINE W REFLEX MICROSCOPIC
Ketones, ur: NEGATIVE mg/dL
Leukocytes, UA: NEGATIVE
Nitrite: NEGATIVE
Protein, ur: 100 mg/dL — AB
Urobilinogen, UA: 0.2 mg/dL (ref 0.0–1.0)

## 2013-10-23 LAB — TROPONIN I
Troponin I: <0.3 ng/mL (ref <0.30)
Troponin I: <0.3 ng/mL (ref <0.30)

## 2013-10-23 LAB — PROTIME-INR
INR: 1 (ref 0.00–1.49)
Prothrombin Time: 13 seconds (ref 11.6–15.2)

## 2013-10-23 MED ORDER — NITROGLYCERIN 2 % TD OINT: 1.0000 [in_us] | TOPICAL_OINTMENT | Freq: Four times a day (QID) | TRANSDERMAL | Status: DC

## 2013-10-23 MED ORDER — FUROSEMIDE 10 MG/ML IJ SOLN
40.0000 mg | Freq: Two times a day (BID) | INTRAMUSCULAR | Status: DC
  Administered 2013-10-23 – 2013-10-25 (×4): 40 mg via INTRAVENOUS
  Filled 2013-10-23 (×6): qty 4

## 2013-10-23 MED ORDER — LABETALOL HCL 5 MG/ML IV SOLN
10.0000 mg | Freq: Four times a day (QID) | INTRAVENOUS | Status: DC | PRN
  Administered 2013-10-23 – 2013-10-26 (×6): 10 mg via INTRAVENOUS
  Filled 2013-10-23 (×7): qty 4

## 2013-10-23 MED ORDER — ACETAMINOPHEN 325 MG PO TABS
650.0000 mg | ORAL_TABLET | ORAL | Status: DC | PRN
  Administered 2013-10-23 – 2013-10-25 (×6): 650 mg via ORAL
  Filled 2013-10-23 (×6): qty 2

## 2013-10-23 MED ORDER — FUROSEMIDE 10 MG/ML IJ SOLN
40.0000 mg | Freq: Once | INTRAMUSCULAR | Status: AC
  Administered 2013-10-23: 40 mg via INTRAVENOUS
  Filled 2013-10-23: qty 4

## 2013-10-23 MED ORDER — INSULIN GLARGINE 100 UNIT/ML ~~LOC~~ SOLN
10.0000 [IU] | Freq: Every day | SUBCUTANEOUS | Status: DC
  Administered 2013-10-23 – 2013-10-26 (×4): 10 [IU] via SUBCUTANEOUS
  Filled 2013-10-23 (×5): qty 0.1

## 2013-10-23 MED ORDER — PANTOPRAZOLE SODIUM 40 MG PO TBEC
40.0000 mg | DELAYED_RELEASE_TABLET | Freq: Every day | ORAL | Status: DC
  Administered 2013-10-24 – 2013-10-27 (×4): 40 mg via ORAL
  Filled 2013-10-23 (×4): qty 1

## 2013-10-23 MED ORDER — HEPARIN SODIUM (PORCINE) 5000 UNIT/ML IJ SOLN
5000.0000 [IU] | Freq: Three times a day (TID) | INTRAMUSCULAR | Status: DC
  Administered 2013-10-23 – 2013-10-27 (×12): 5000 [IU] via SUBCUTANEOUS
  Filled 2013-10-23 (×16): qty 1

## 2013-10-23 MED ORDER — ATORVASTATIN CALCIUM 20 MG PO TABS
20.0000 mg | ORAL_TABLET | Freq: Every day | ORAL | Status: DC
  Administered 2013-10-23 – 2013-10-26 (×4): 20 mg via ORAL
  Filled 2013-10-23 (×6): qty 1

## 2013-10-23 MED ORDER — SODIUM CHLORIDE 0.9 % IJ SOLN: 3.0000 mL | INTRAMUSCULAR | Status: DC | PRN

## 2013-10-23 MED ORDER — PNEUMOCOCCAL VAC POLYVALENT 25 MCG/0.5ML IJ INJ
0.5000 mL | INJECTION | INTRAMUSCULAR | Status: AC
  Administered 2013-10-25: 0.5 mL via INTRAMUSCULAR
  Filled 2013-10-23: qty 0.5

## 2013-10-23 MED ORDER — ASPIRIN EC 81 MG PO TBEC
81.0000 mg | DELAYED_RELEASE_TABLET | Freq: Every day | ORAL | Status: DC
  Administered 2013-10-23 – 2013-10-27 (×5): 81 mg via ORAL
  Filled 2013-10-23 (×5): qty 1

## 2013-10-23 MED ORDER — POTASSIUM CHLORIDE CRYS ER 20 MEQ PO TBCR
20.0000 meq | EXTENDED_RELEASE_TABLET | Freq: Two times a day (BID) | ORAL | Status: DC
  Administered 2013-10-23 – 2013-10-27 (×9): 20 meq via ORAL
  Filled 2013-10-23 (×10): qty 1

## 2013-10-23 MED ORDER — SODIUM CHLORIDE 0.9 % IV SOLN
250.0000 mL | INTRAVENOUS | Status: DC | PRN
  Administered 2013-10-23: 1000 mL via INTRAVENOUS

## 2013-10-23 MED ORDER — INSULIN ASPART 100 UNIT/ML ~~LOC~~ SOLN
0.0000 [IU] | Freq: Three times a day (TID) | SUBCUTANEOUS | Status: DC
  Administered 2013-10-23: 1 [IU] via SUBCUTANEOUS
  Administered 2013-10-23: 7 [IU] via SUBCUTANEOUS

## 2013-10-23 MED ORDER — SODIUM CHLORIDE 0.9 % IJ SOLN
3.0000 mL | Freq: Two times a day (BID) | INTRAMUSCULAR | Status: DC
  Administered 2013-10-23 – 2013-10-26 (×6): 3 mL via INTRAVENOUS

## 2013-10-23 MED ORDER — NITROGLYCERIN IN D5W 200-5 MCG/ML-% IV SOLN
10.0000 ug/min | INTRAVENOUS | Status: DC
  Administered 2013-10-24: 50 ug/min via INTRAVENOUS
  Administered 2013-10-24 (×2): 75 ug/min via INTRAVENOUS
  Administered 2013-10-26: 10 ug/min via INTRAVENOUS
  Filled 2013-10-23 (×4): qty 250

## 2013-10-23 MED ORDER — ONDANSETRON HCL 4 MG/2ML IJ SOLN: 4.0000 mg | Freq: Four times a day (QID) | INTRAMUSCULAR | Status: DC | PRN

## 2013-10-23 MED ORDER — NITROGLYCERIN IN D5W 200-5 MCG/ML-% IV SOLN
2.0000 ug/min | Freq: Once | INTRAVENOUS | Status: AC
  Administered 2013-10-23: 5 ug/min via INTRAVENOUS
  Filled 2013-10-23: qty 250

## 2013-10-23 MED ORDER — INFLUENZA VAC SPLIT QUAD 0.5 ML IM SUSP
0.5000 mL | INTRAMUSCULAR | Status: DC
  Filled 2013-10-23: qty 0.5

## 2013-10-23 MED ORDER — INSULIN ASPART 100 UNIT/ML ~~LOC~~ SOLN
0.0000 [IU] | Freq: Three times a day (TID) | SUBCUTANEOUS | Status: DC
  Administered 2013-10-24 (×2): 2 [IU] via SUBCUTANEOUS
  Administered 2013-10-25 (×2): 1 [IU] via SUBCUTANEOUS
  Administered 2013-10-26 (×2): 2 [IU] via SUBCUTANEOUS
  Administered 2013-10-27: 1 [IU] via SUBCUTANEOUS

## 2013-10-23 NOTE — Progress Notes (Signed)
  Echocardiogram 2D Echocardiogram has been performed.  Georgian Co 10/23/2013, 5:06 PM

## 2013-10-23 NOTE — ED Notes (Signed)
Patient does have edema in both legs, 3+ on the right leg and 3+ on the left leg.

## 2013-10-23 NOTE — ED Provider Notes (Signed)
CSN: [id number]     Arrival date & time 10/23/13  1027 History   First MD Initiated Contact with Patient 10/23/13 1040     Chief Complaint  Patient presents with  . Shortness of Breath   (Consider location/radiation/quality/duration/timing/severity/associated sxs/prior Treatment) HPI  This a 68 year old female with history of diabetes, hypertension and was recently placed on Lasix who presents with acute respiratory distress. Per EMS they were called to the house for shortness of breath. Patient reported shortness of breath for 2 days. She also reported increased lower extremity swelling. She saw her primary doctor yesterday for that and was increased on her Lasix. She was noted to be hypertensive in route. At initial O2 sats were 78%. Patient was placed on nonrebreather. Per EMS report, her work of breathing increased acutely in route. Patient denies any chest pain but does state she has shortness of breath and orthopnea. She has no history of MI or CHF that she knows of.  Past Medical History  Diagnosis Date  . Diabetes mellitus   . Hypertension   . Arthritis   . Sleep apnea    Past Surgical History  Procedure Laterality Date  . Total shoulder replacement      right   Family History  Problem Relation Age of Onset  . Diabetes Brother    History  Substance Use Topics  . Smoking status: Former Games developer  . Smokeless tobacco: Never Used  . Alcohol Use: No   OB History   Grav Para Term Preterm Abortions TAB SAB Ect Mult Living                 Review of Systems  Unable to perform ROS: Acuity of condition   Level 5 caveat applies  Allergies  Lisinopril and Penicillins  Home Medications   Current Outpatient Rx  Name  Route  Sig  Dispense  Refill  . furosemide (LASIX) 40 MG tablet   Oral   Take 40 mg by mouth daily.         Marland Kitchen atorvastatin (LIPITOR) 80 MG tablet   Oral   Take 80 mg by mouth daily.         . cloNIDine (CATAPRES) 0.2 MG tablet   Oral   Take 0.2 mg  by mouth daily.           Marland Kitchen EXFORGE HCT 10-320-25 MG TABS   Oral   Take 1 tablet by mouth daily.         Marland Kitchen glipiZIDE (GLUCOTROL) 10 MG tablet   Oral   Take 10 mg by mouth 2 (two) times daily before a meal.           . irbesartan (AVAPRO) 300 MG tablet   Oral   Take 300 mg by mouth at bedtime.           Marland Kitchen KLOR-CON M20 20 MEQ tablet   Oral   Take 20 mEq by mouth daily.         . metFORMIN (GLUCOPHAGE) 1000 MG tablet   Oral   Take 1,000 mg by mouth 2 (two) times daily with a meal.           . rOPINIRole (REQUIP) 0.5 MG tablet   Oral   Take 2 mg by mouth at bedtime.          . saxagliptin HCl (ONGLYZA) 2.5 MG TABS tablet   Oral   Take 2.5 mg by mouth daily.           Marland Kitchen  TRAVATAN Z 0.004 % SOLN ophthalmic solution                BP 175/85  Pulse 92  Resp 36  SpO2 95% Physical Exam  Nursing note and vitals reviewed. Constitutional: She is oriented to person, place, and time.  Tachypnea, increased work of breathing, respiratory distress  HENT:  Head: Normocephalic and atraumatic.  Neck: Neck supple.  Cardiovascular: Normal rate and regular rhythm.   Distant breath sounds  Pulmonary/Chest: She is in respiratory distress.  Increased respiratory effort, inspiratory wheezing with crackles at the bases  Abdominal: Soft. Bowel sounds are normal. There is no tenderness.  Musculoskeletal:  3+ bilateral lower extremity pitting edema  Neurological: She is alert and oriented to person, place, and time.  Skin: Skin is warm and dry.  Psychiatric: She has a normal mood and affect.    ED Course  Procedures (including critical care time) Labs Review Labs Reviewed  PRO B NATRIURETIC PEPTIDE - Abnormal; Notable for the following:    Pro B Natriuretic peptide (BNP) 25752.0 (*)    All other components within normal limits  CBC - Abnormal; Notable for the following:    WBC 12.3 (*)    RBC 3.60 (*)    Hemoglobin 10.9 (*)    HCT 31.9 (*)    Platelets 439 (*)     All other components within normal limits  POCT I-STAT 3, BLOOD GAS (G3+) - Abnormal; Notable for the following:    pH, Arterial 7.276 (*)    pCO2 arterial 52.2 (*)    pO2, Arterial 161.0 (*)    Bicarbonate 24.3 (*)    Acid-base deficit 3.0 (*)    All other components within normal limits  POCT I-STAT, CHEM 8 - Abnormal; Notable for the following:    BUN 55 (*)    Creatinine, Ser 3.00 (*)    Glucose, Bld 335 (*)    Hemoglobin 11.9 (*)    HCT 35.0 (*)    All other components within normal limits  POCT I-STAT TROPONIN I   Imaging Review Dg Chest Portable 1 View  10/23/2013   CLINICAL DATA:  Respiratory distress  EXAM: PORTABLE CHEST - 1 VIEW  COMPARISON:  Prior chest x-ray 10/14/2010  FINDINGS: Bilateral interstitial and airspace opacities in the mid and lower lungs in a predominantly perihilar and lower lobe distribution. Slightly enlarged cardiopericardial silhouette. Atherosclerotic calcifications are noted in the transverse aorta. Surgical changes of prior right shoulder arthroplasty. 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 No pneumothorax. No acute osseous abnormality.  IMPRESSION: Imaging findings are most suggestive of mild-moderate CHF.  Multifocal pneumonia could have a similar appearance in the appropriate clinical setting.   Electronically Signed   By: Malachy Moan M.D.   On: 10/23/2013 10:51    EKG Interpretation     Ventricular Rate:  99 PR Interval:  176 QRS Duration: 105 QT Interval:  355 QTC Calculation: 456 R Axis:   -57 Text Interpretation:  Sinus rhythm Ventricular premature complex s LVH with secondary repolarization abnormality Inferior infarct, old Anterior infarct, old           CRITICAL CARE Performed by: Ross Marcus, F   Total critical care time: 35 min  Critical care time was exclusive of separately billable procedures and treating other patients.  Critical care was necessary to treat  or prevent imminent or life-threatening deterioration.  Critical care was time spent personally by me on the following activities: development  of treatment plan with patient and/or surrogate as well as nursing, discussions with consultants, evaluation of patient's response to treatment, examination of patient, obtaining history from patient or surrogate, ordering and performing treatments and interventions, ordering and review of laboratory studies, ordering and review of radiographic studies, pulse oximetry and re-evaluation of patient's condition.  MDM   1. Acute heart failure   2. Acute kidney failure   3. Respiratory distress     This is a 68 year old female who presents in acute respiratory distress. She is hypoxic and tachypneic on initial presentation. She was placed on BiPAP with improvement of her O2 sats. She has evidence of lower extremity edema and crackles on her pulmonary exam. Clinically she appears to be in heart failure. Labwork was obtained and is notable for a BNP of greater than 25,000, creatinine of 3.0 which is up from the patent baseline, and a negative trop. EKG shows sinus rhythm with paired PVCs frequently. I spoke with Dr. Sharyn Lull who reports that the patient had an echo in 2013 showed a normal EF. Unfortunately, I am unable to view the patient's old medical records. The last visit and her current chart is 2012. Patient was placed on a nitroglycerin drip and given IV Lasix. She improved on BiPAP. Patient will be admitted to Dr. Sharyn Lull.   Shon Baton, MD 10/23/13 [phone]

## 2013-10-23 NOTE — ED Notes (Signed)
Pt called ems for sob, pt initially ambulatory with sats 78% and pt progressively decompensated, arrived sats in 50s with nrb, placed on bipap

## 2013-10-23 NOTE — Progress Notes (Signed)
Dr. Sharyn Lull paged re: BP. Received orders for 40 Lasix once and 10 labetalol q 6 prn.

## 2013-10-23 NOTE — Consult Note (Signed)
Reason for Consult:AKI/CKD Referring Physician: Sharyn Lull, MD  [name] is an 68 y.o. female.  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 Annitta Jersey office yesterday c/o 2 day h/o increasing lower ext edema and worsening SOB.  Her diuretics were increased yesterday without sig improvement, she then called EMS and was brought to [facility] ED and placed on non-rebreather.  We were asked to see the patient due to AKI/CKD.    Of note, she had been taking exforge and metformin until she was told to stop these yesterday.  She was also started on lasix 40mg  daily.  She denies any NSAIDs/COX-II I's or new meds other than lasix. She has not had any N/V/D, dysuria, pyuria, hematuria, urinary retention, or foamy/frothy urine.    Trend in Creatinine: Creatinine, Ser  Date/Time Value Range Status  10/23/2013 10:52 AM 3.00* 0.50 - 1.10 mg/dL Final  1/61/0960 45:40 PM 0.86  0.4 - 1.2 mg/dL Final  98/10/9146  8:29 PM 0.85  0.4 - 1.2 mg/dL Final  5/62/1308 65:78 PM 0.87  0.40-1.20 mg/dL Final    PMH:   Past Medical History  Diagnosis Date  . Diabetes mellitus   . Hypertension   . Arthritis   . Sleep apnea     PSH:   Past Surgical History  Procedure Laterality Date  . Total shoulder replacement      right    Allergies:  Allergies  Allergen Reactions  . Lisinopril Cough    REACTION: Cough  . Penicillins Rash    REACTION: Rash    Medications:   Prior to Admission medications   Medication Sig Start Date End Date Taking? Authorizing Provider  aspirin EC 81 MG tablet Take 81 mg by mouth daily.   Yes Historical Provider, MD  atorvastatin (LIPITOR) 80 MG tablet Take 40 mg by mouth daily.   Yes Historical Provider, MD  cloNIDine (CATAPRES) 0.2 MG tablet Take 0.2 mg by mouth 3 (three) times daily.   Yes Historical Provider, MD  furosemide (LASIX) 40 MG tablet Take 40 mg by mouth 2 (two) times daily.   Yes Historical  Provider, MD  glipiZIDE (GLUCOTROL XL) 10 MG 24 hr tablet Take 10 mg by mouth 2 (two) times daily.   Yes Historical Provider, MD  KLOR-CON M20 20 MEQ tablet Take 20 mEq by mouth daily. 10/09/13  Yes Historical Provider, MD  metoprolol (LOPRESSOR) 50 MG tablet Take 50 mg by mouth 3 (three) times daily.   Yes Historical Provider, MD  saxagliptin HCl (ONGLYZA) 2.5 MG TABS tablet Take 2.5 mg by mouth daily.     Yes Historical Provider, MD  Travoprost, BAK Free, (TRAVATAN) 0.004 % SOLN ophthalmic solution Place 1 drop into both eyes at bedtime.   Yes Historical Provider, MD    Inpatient medications: . aspirin EC  81 mg Oral Daily  . atorvastatin  20 mg Oral q1800  . furosemide  40 mg Intravenous Q12H  . heparin  5,000 Units Subcutaneous Q8H  . insulin aspart  0-9 Units Subcutaneous TID WC  . insulin glargine  10 Units Subcutaneous QHS  . [START ON 10/24/2013] pantoprazole  40 mg Oral Q0600  . potassium chloride  20 mEq Oral BID  . sodium chloride  3 mL Intravenous Q12H    Discontinued Meds:   Medications Discontinued During This Encounter  Medication Reason  . amLODipine (NORVASC) 5 MG tablet Inpatient Standard  .  peg 3350 powder (MOVIPREP) 100 G SOLR Patient has not taken in last 30 days  . simvastatin (ZOCOR) 40 MG tablet Duplicate  . valsartan (DIOVAN) 320 MG tablet Duplicate  . hydrochlorothiazide 25 MG tablet Duplicate  . furosemide (LASIX) 40 MG tablet Inpatient Standard  . nitroGLYCERIN (NITROGLYN) 2 % ointment 1 inch   . cloNIDine (CATAPRES) 0.2 MG tablet Entry Error  . furosemide (LASIX) 40 MG tablet Inpatient Standard  . atorvastatin (LIPITOR) 80 MG tablet Inpatient Standard  . EXFORGE HCT 10-320-25 MG TABS Patient has not taken in last 30 days  . glipiZIDE (GLUCOTROL) 10 MG tablet Entry Error  . irbesartan (AVAPRO) 300 MG tablet Patient has not taken in last 30 days  . metFORMIN (GLUCOPHAGE) 1000 MG tablet Patient has not taken in last 30 days  . rOPINIRole (REQUIP) 0.5 MG  tablet Patient has not taken in last 30 days  . TRAVATAN Z 0.004 % SOLN ophthalmic solution Inpatient Standard    Social History:  reports that she has quit smoking. She has never used smokeless tobacco. She reports that she does not drink alcohol or use illicit drugs.  Family History:   Family History  Problem Relation Age of Onset  . Diabetes Brother     Pertinent items are noted in HPI. Weight change:   Intake/Output Summary (Last 24 hours) at 10/23/13 1458 Last data filed at 10/23/13 1400  Gross per 24 hour  Intake     11 ml  Output    575 ml  Net   -564 ml   BP 193/93  Pulse 78  Temp(Src) 98.4 F (36.9 C) (Axillary)  Resp 24  Ht 5' (1.524 m)  Wt 88.451 kg (195 lb)  BMI 38.08 kg/m2  SpO2 100% Filed Vitals:   10/23/13 1345 10/23/13 1400 10/23/13 1415 10/23/13 1430  BP: 197/95 190/97 192/97 193/93  Pulse: 83 77 82 78  Temp:      TempSrc:      Resp: 26 26 26 24   Height:      Weight:      SpO2: 99% 99% 99% 100%     General appearance: alert, cooperative and wearing BiPAP but comfortable Head: Normocephalic, without obvious abnormality, atraumatic Eyes: negative findings: lids and lashes normal, conjunctivae and sclerae normal and corneas clear Neck: no adenopathy, no carotid bruit, no JVD, supple, symmetrical, trachea midline and thyroid not enlarged, symmetric, no tenderness/mass/nodules Resp: rhonchi bilaterally Cardio: no rub GI: soft, non-tender; bowel sounds normal; no masses,  no organomegaly Extremities: edema 2+ Neuro: AA and oriented X3, no asterixis, grossly nonfocal  Labs: Basic Metabolic Panel:  Recent Labs Lab 10/23/13 1052  NA 139  K 4.2  CL 103  GLUCOSE 335*  BUN 55*  CREATININE 3.00*   Liver Function Tests: No results found for this basename: AST, ALT, ALKPHOS, BILITOT, PROT, ALBUMIN,  in the last 168 hours No results found for this basename: LIPASE, AMYLASE,  in the last 168 hours No results found for this basename: AMMONIA,  in the  last 168 hours CBC:  Recent Labs Lab 10/23/13 1040 10/23/13 1052  WBC 12.3*  --   HGB 10.9* 11.9*  HCT 31.9* 35.0*  MCV 88.6  --   PLT 439*  --    PT/INR: @LABRCNTIP (inr:5) Cardiac Enzymes: )No results found for this basename: CKTOTAL, CKMB, CKMBINDEX, TROPONINI,  in the last 168 hours CBG:  Recent Labs Lab 10/23/13 1252  GLUCAP 336*    Iron Studies: No results found for this basename:  IRON, TIBC, TRANSFERRIN, FERRITIN,  in the last 168 hours  Xrays/Other Studies: Dg Chest Portable 1 View  10/23/2013   CLINICAL DATA:  Respiratory distress  EXAM: PORTABLE CHEST - 1 VIEW  COMPARISON:  Prior chest x-ray 10/14/2010  FINDINGS: Bilateral interstitial and airspace opacities in the mid and lower lungs in a predominantly perihilar and lower lobe distribution. Slightly enlarged cardiopericardial silhouette. Atherosclerotic calcifications are noted in the transverse aorta. Surgical changes of prior right shoulder arthroplasty. 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 No pneumothorax. No acute osseous abnormality.  IMPRESSION: Imaging findings are most suggestive of mild-moderate CHF.  Multifocal pneumonia could have a similar appearance in the appropriate clinical setting.   Electronically Signed   By: Malachy Moan M.D.   On: 10/23/2013 10:51     Assessment/Plan: 1.  AKI/CKD- pt with malignant HTN, acute decompensated CHF in setting of ARB.  Has never seen nephrology but has h/o CKD, longstanding proteinuria, and Scr of 2 at baseline.  Will order renal US, UA and serologies.  Will also quantify proteinuria as well as check SPEP/UPEP. 2. Acute respiratory distress- with initial hypoxia and now respiratory acidosis and hypercapnia.  On BiPap. 3. Pulmonary edema/acute decompensated CHF- start IV lasix and follow.  Would also check ECHO if not done recently. R/o for MI 4. Malignant HTN/urgent HTN- per cardiology.  Possibly will require cardene  gtt or clonidine tid. Consider renal artery duplex 5. DM- per primary svc.  Will need sliding scale.   Rya Rausch A 10/23/2013, 2:58 PM

## 2013-10-23 NOTE — Progress Notes (Signed)
Patient transported from ED to 2H13 on BiPAP. Tolerated well. SAT decreased to 91% with turning upon arrival, so FIO2 increased to 60%. RT will continue to monitor.

## 2013-10-23 NOTE — Progress Notes (Addendum)
Dr. Sharyn Lull called back to check on pt. BP still high. Advised to titrate NTG. Increased to 40 mcg/min.

## 2013-10-23 NOTE — Care Management Note (Signed)
    Page 1 of 1   10/27/2013     3:48:34 PM   CARE MANAGEMENT NOTE 10/27/2013  Patient:  [name], [name]   Account Number:  [id number]  Date Initiated:  10/23/2013  Documentation initiated by:  Junius Creamer  Subjective/Objective Assessment:   adm w heart failure     Action/Plan:   lives alone   Anticipated DC Date:  10/27/2013   Anticipated DC Plan:  HOME W HOME HEALTH SERVICES      DC Planning Services  CM consult      Choice offered to / List presented to:          [facility] arranged  HH-1 RN      Status of service:  Completed, signed off Medicare Important Message given?   (If response is "NO", the following Medicare IM given date fields will be blank) Date Medicare IM given:   Date Additional Medicare IM given:    Discharge Disposition:  HOME/SELF CARE  Per UR Regulation:  Reviewed for med. necessity/level of care/duration of stay  If discussed at Long Length of Stay Meetings, dates discussed:    Comments:  10/27/13 [name] [phone] PT FOR DISCHARGE HOME TODAY.  PT STATES LIVES WITH FRIEND. SHE POLITELY REFUSES HH FOLLOW UP, AS RECOMMENDED BY P.T.  10/24 1441 Robin dowell rn,bsn pt in w heart failure. will follow and assist as needed.

## 2013-10-23 NOTE — H&P (Signed)
[name] is an 68 y.o. female.   Chief Complaint: Progressive increasing shortness of breath  HPI: Patient is 68 year old female with past medical history significant for hypertension, non-insulin-dependent diabetes mellitus, hypercholesteremia, diabetic neuropathy, chronic kidney disease stage IV, creatinine runs around 2.0, degenerative joint disease, came to the ER by EMS complaining of progressive increasing shortness of breath for last 2 days patient was seen in my office yesterday for same associated with leg swelling and her Lasix dose was increased without much improvement. Patient denies any chest pain nausea or vomiting diaphoresis. Patient does give history of PND orthopnea leg swelling. Denies any palpitation lightheadedness or syncopal episode. Patient was noted to be hypoxic with O2 sats in the 70s and acidotic was placed on BiPAP and received 80 mg IV Lasix and placed on mitral drip with improvement in her symptoms.  Past Medical History  Diagnosis Date  . Diabetes mellitus   . Hypertension   . Arthritis   . Sleep apnea     Past Surgical History  Procedure Laterality Date  . Total shoulder replacement      right    Family History  Problem Relation Age of Onset  . Diabetes Brother    Social History:  reports that she has quit smoking. She has never used smokeless tobacco. She reports that she does not drink alcohol or use illicit drugs.  Allergies:  Allergies  Allergen Reactions  . Lisinopril     REACTION: Cough  . Penicillins     REACTION: Rash     (Not in a hospital admission)  Results for orders placed during the hospital encounter of 10/23/13 (from the past 48 hour(s))  PRO B NATRIURETIC PEPTIDE     Status: Abnormal   Collection Time    10/23/13 10:34 AM      Result Value Range   Pro B Natriuretic peptide (BNP) 25752.0 (*) 0 - 125 pg/mL  CBC     Status: Abnormal   Collection Time    10/23/13 10:40 AM      Result Value Range   WBC 12.3 (*) 4.0 - 10.5  K/uL   RBC 3.60 (*) 3.87 - 5.11 MIL/uL   Hemoglobin 10.9 (*) 12.0 - 15.0 g/dL   HCT 16.1 (*) 09.6 - 04.5 %   MCV 88.6  78.0 - 100.0 fL   MCH 30.3  26.0 - 34.0 pg   MCHC 34.2  30.0 - 36.0 g/dL   RDW 40.9  81.1 - 91.4 %   Platelets 439 (*) 150 - 400 K/uL  POCT I-STAT TROPONIN I     Status: None   Collection Time    10/23/13 10:50 AM      Result Value Range   Troponin i, poc 0.00  0.00 - 0.08 ng/mL   Comment 3            Comment: Due to the release kinetics of cTnI,     a negative result within the first hours     of the onset of symptoms does not rule out     myocardial infarction with certainty.     If myocardial infarction is still suspected,     repeat the test at appropriate intervals.  POCT I-STAT, CHEM 8     Status: Abnormal   Collection Time    10/23/13 10:52 AM      Result Value Range   Sodium 139  135 - 145 mEq/L   Potassium 4.2  3.5 - 5.1 mEq/L  Chloride 103  96 - 112 mEq/L   BUN 55 (*) 6 - 23 mg/dL   Creatinine, Ser 6.57 (*) 0.50 - 1.10 mg/dL   Glucose, Bld 846 (*) 70 - 99 mg/dL   Calcium, Ion 9.62  9.52 - 1.30 mmol/L   TCO2 23  0 - 100 mmol/L   Hemoglobin 11.9 (*) 12.0 - 15.0 g/dL   HCT 84.1 (*) 32.4 - 40.1 %  POCT I-STAT 3, BLOOD GAS (G3+)     Status: Abnormal   Collection Time    10/23/13 10:54 AM      Result Value Range   pH, Arterial 7.276 (*) 7.350 - 7.450   pCO2 arterial 52.2 (*) 35.0 - 45.0 mmHg   pO2, Arterial 161.0 (*) 80.0 - 100.0 mmHg   Bicarbonate 24.3 (*) 20.0 - 24.0 mEq/L   TCO2 26  0 - 100 mmol/L   O2 Saturation 99.0     Acid-base deficit 3.0 (*) 0.0 - 2.0 mmol/L   Collection site RADIAL, ALLEN'S TEST ACCEPTABLE     Drawn by RT     Sample type ARTERIAL     Dg Chest Portable 1 View  10/23/2013   CLINICAL DATA:  Respiratory distress  EXAM: PORTABLE CHEST - 1 VIEW  COMPARISON:  Prior chest x-ray 10/14/2010  FINDINGS: Bilateral interstitial and airspace opacities in the mid and lower lungs in a predominantly perihilar and lower lobe  distribution. Slightly enlarged cardiopericardial silhouette. Atherosclerotic calcifications are noted in the transverse aorta. Surgical changes of prior right shoulder arthroplasty. 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 No pneumothorax. No acute osseous abnormality.  IMPRESSION: Imaging findings are most suggestive of mild-moderate CHF.  Multifocal pneumonia could have a similar appearance in the appropriate clinical setting.   Electronically Signed   By: Malachy Moan M.D.   On: 10/23/2013 10:51    Review of Systems  Constitutional: Negative for fever and chills.  Eyes: Negative for blurred vision and double vision.  Respiratory: Negative for cough, hemoptysis and sputum production.   Cardiovascular: Positive for orthopnea, leg swelling and PND. Negative for chest pain and palpitations.  Gastrointestinal: Negative for nausea, vomiting and abdominal pain.  Genitourinary: Negative for dysuria.  Neurological: Negative for dizziness and headaches.    Blood pressure 184/81, pulse 92, resp. rate 35, SpO2 96.00%. Physical Exam  Constitutional: She is oriented to person, place, and time.  HENT:  Head: Normocephalic.  Eyes: Conjunctivae are normal. Pupils are equal, round, and reactive to light. Left eye exhibits no discharge. No scleral icterus.  Neck: Neck supple. JVD present. No thyromegaly present.  Cardiovascular: Normal rate and regular rhythm.   Murmur (Soft systolic murmur and S3 gallop noted) heard. Respiratory:  Bilateral rales noted and decreased breath sounds at bases  GI: Soft. Bowel sounds are normal. She exhibits distension. There is no tenderness. There is no rebound.  Musculoskeletal:  No clubbing cyanosis 4+ edema noted  Lymphadenopathy:    She has no cervical adenopathy.  Neurological: She is alert and oriented to person, place, and time.     Assessment/Plan Acute pulmonary edema rule out ischemia/MI Acute respiratory  failure secondary to above Uncontrolled hypertension Non-insulin-dependent diabetes mellitus  Acute on chronic kidney disease stage IV Hypercholesteremia Diabetic neuropathy Morbid obesity Positive family history of coronary artery disease Plan As per orders    Kiyara Bouffard N 10/23/2013, 11:40 AM

## 2013-10-23 NOTE — ED Notes (Signed)
The patient called EMS, patient said she was getting SOB and weak.  The patient was walking and waving the ambulance down outside her home.  The patient walked to the ambulance.  On the way to the hospital, the patient's respirations increased, sats were in the 70's so they put her on the non-rebreather and her oxygen level came up to the 90's.  The patient's effort increased with the non-rebreather.  The patient was transported here to be evaluated.

## 2013-10-23 NOTE — ED Notes (Signed)
Brother contact information- Acquanetta Sit [phone]

## 2013-10-24 LAB — RENAL FUNCTION PANEL
CO2: 23 mEq/L (ref 19–32)
Calcium: 9.1 mg/dL (ref 8.4–10.5)
Chloride: 102 mEq/L (ref 96–112)
GFR calc Af Amer: 19 mL/min — ABNORMAL LOW (ref >90)
GFR calc non Af Amer: 16 mL/min — ABNORMAL LOW (ref >90)
Glucose, Bld: 125 mg/dL — ABNORMAL HIGH (ref 70–99)
Potassium: 4 mEq/L (ref 3.5–5.1)
Sodium: 140 mEq/L (ref 135–145)

## 2013-10-24 LAB — GLUCOSE, CAPILLARY
Glucose-Capillary: 157 mg/dL — ABNORMAL HIGH (ref 70–99)
Glucose-Capillary: 184 mg/dL — ABNORMAL HIGH (ref 70–99)
Glucose-Capillary: 224 mg/dL — ABNORMAL HIGH (ref 70–99)

## 2013-10-24 LAB — IRON AND TIBC
Iron: 14 ug/dL — ABNORMAL LOW (ref 42–135)
TIBC: 218 ug/dL — ABNORMAL LOW (ref 250–470)
UIBC: 204 ug/dL (ref 125–400)

## 2013-10-24 LAB — FERRITIN: Ferritin: 126 ng/mL (ref 10–291)

## 2013-10-24 LAB — TSH: TSH: 1.676 u[IU]/mL (ref 0.350–4.500)

## 2013-10-24 MED ORDER — ISOSORB DINITRATE-HYDRALAZINE 20-37.5 MG PO TABS
1.0000 | ORAL_TABLET | Freq: Three times a day (TID) | ORAL | Status: DC
  Administered 2013-10-24 – 2013-10-27 (×10): 1 via ORAL
  Filled 2013-10-24 (×13): qty 1

## 2013-10-24 MED ORDER — HYDRALAZINE HCL 20 MG/ML IJ SOLN
10.0000 mg | Freq: Four times a day (QID) | INTRAMUSCULAR | Status: DC | PRN
  Administered 2013-10-25 (×2): 10 mg via INTRAVENOUS
  Filled 2013-10-24 (×2): qty 1

## 2013-10-24 NOTE — Progress Notes (Signed)
Patient ID: [name], female   DOB: 01-27-1945, 68 y.o.   MRN: [id number] S:feels better O:BP 173/84  Pulse 84  Temp(Src) 98.5 F (36.9 C) (Oral)  Resp 24  Ht 5' (1.524 m)  Wt 88.451 kg (195 lb)  BMI 38.08 kg/m2  SpO2 98%  Intake/Output Summary (Last 24 hours) at 10/24/13 0900 Last data filed at 10/24/13 0800  Gross per 24 hour  Intake  572.5 ml  Output   2575 ml  Net -2002.5 ml   Intake/Output: I/O last 3 completed shifts: In: 547.5 [P.O.:120; I.V.:427.5] Out: 2575 [Urine:2575]  Intake/Output this shift:  Total I/O In: 25 [I.V.:25] Out: -  Weight change:  Gen:WD elderly AAF in NAD CVS:no rub Resp:cta XLK:GMWNUU Ext:1+ edema b/l lower ext   Recent Labs Lab 10/23/13 1052 10/23/13 1445 10/24/13 0405  NA 139 143 140  K 4.2 4.5 4.0  CL 103 103 102  CO2  --  28 23  GLUCOSE 335* 254* 125*  BUN 55* 43* 43*  CREATININE 3.00* 2.85* 2.79*  ALBUMIN  --  2.9* 2.5*  CALCIUM  --  9.7 9.1  PHOS  --   --  4.8*  AST  --  26  --   ALT  --  25  --    Liver Function Tests:  Recent Labs Lab 10/23/13 1445 10/24/13 0405  AST 26  --   ALT 25  --   ALKPHOS 95  --   BILITOT 0.2*  --   PROT 7.1  --   ALBUMIN 2.9* 2.5*   No results found for this basename: LIPASE, AMYLASE,  in the last 168 hours No results found for this basename: AMMONIA,  in the last 168 hours CBC:  Recent Labs Lab 10/23/13 1040 10/23/13 1052 10/23/13 1445  WBC 12.3*  --  8.6  NEUTROABS  --   --  7.3  HGB 10.9* 11.9* 9.7*  HCT 31.9* 35.0* 28.5*  MCV 88.6  --  89.3  PLT 439*  --  376   Cardiac Enzymes:  Recent Labs Lab 10/23/13 1445 10/23/13 1545 10/23/13 2134  CKTOTAL  --  211*  --   TROPONINI <0.30  --  <0.30   CBG:  Recent Labs Lab 10/23/13 1252 10/23/13 1729 10/23/13 2143 10/24/13 0801  GLUCAP 336* 149* 230* 89    Iron Studies: No results found for this basename: IRON, TIBC, TRANSFERRIN, FERRITIN,  in the last 72 hours Studies/Results: US Renal  10/23/2013   CLINICAL  DATA:  Acute on chronic renal failure  EXAM: RENAL/URINARY TRACT ULTRASOUND COMPLETE  COMPARISON:  CT abdomen pelvis dated 06/10/2011  FINDINGS: Right Kidney  Length: 10.8 cm. Echogenic renal parenchyma, suggesting medical renal disease. No mass or hydronephrosis.  Left Kidney  Length: 10.8 cm. Echogenic renal parenchyma, suggesting medical renal disease. No mass or hydronephrosis.  Bladder  Within normal limits. Indwelling Foley catheter.  IMPRESSION: No hydronephrosis.  Echogenic renal parenchyma, suggesting medical renal disease.  Foley catheter within the bladder.   Electronically Signed   By: Charline Bills M.D.   On: 10/23/2013 16:08   Dg Chest Portable 1 View  10/23/2013   CLINICAL DATA:  Respiratory distress  EXAM: PORTABLE CHEST - 1 VIEW  COMPARISON:  Prior chest x-ray 10/14/2010  FINDINGS: Bilateral interstitial and airspace opacities in the mid and lower lungs in a predominantly perihilar and lower lobe distribution. Slightly enlarged cardiopericardial silhouette. Atherosclerotic calcifications are noted in the transverse aorta. Surgical changes of prior right shoulder arthroplasty. There are  advanced degenerative osteoarthritic changes in the left glenohumeral joint. No pneumothorax. No acute osseous abnormality.  IMPRESSION: Imaging findings are most suggestive of mild-moderate CHF.  Multifocal pneumonia could have a similar appearance in the appropriate clinical setting.   Electronically Signed   By: Malachy Moan M.D.   On: 10/23/2013 10:51   . aspirin EC  81 mg Oral Daily  . atorvastatin  20 mg Oral q1800  . furosemide  40 mg Intravenous Q12H  . heparin  5,000 Units Subcutaneous Q8H  . influenza vac split quadrivalent PF  0.5 mL Intramuscular Tomorrow-1000  . insulin aspart  0-9 Units Subcutaneous TID WC  . insulin glargine  10 Units Subcutaneous QHS  . pantoprazole  40 mg Oral Q0600  . pneumococcal 23 valent vaccine  0.5 mL Intramuscular Tomorrow-1000  . potassium chloride  20  mEq Oral BID  . sodium chloride  3 mL Intravenous Q12H    BMET    Component Value Date/Time   NA 140 10/24/2013 0405   K 4.0 10/24/2013 0405   CL 102 10/24/2013 0405   CO2 23 10/24/2013 0405   GLUCOSE 125* 10/24/2013 0405   BUN 43* 10/24/2013 0405   CREATININE 2.79* 10/24/2013 0405   CALCIUM 9.1 10/24/2013 0405   GFRNONAA 16* 10/24/2013 0405   GFRAA 19* 10/24/2013 0405   CBC    Component Value Date/Time   WBC 8.6 10/23/2013 1445   RBC 3.19* 10/23/2013 1445   HGB 9.7* 10/23/2013 1445   HCT 28.5* 10/23/2013 1445   PLT 376 10/23/2013 1445   MCV 89.3 10/23/2013 1445   MCH 30.4 10/23/2013 1445   MCHC 34.0 10/23/2013 1445   RDW 15.5 10/23/2013 1445   LYMPHSABS 0.9 10/23/2013 1445   MONOABS 0.4 10/23/2013 1445   EOSABS 0.0 10/23/2013 1445   BASOSABS 0.0 10/23/2013 1445   Impression: 1. Acute respiratory distress 2. Hypercapnic respiratory acidosis 3. AKI/CKD 4. Malignant HTN 5. Acute decompensated CHF 6. DM 7. Normocytic anemia  Assessment/Plan:  1. AKI/CKD- pt with malignant HTN, acute decompensated CHF in setting of ARB. Has never seen nephrology but has h/o CKD, longstanding proteinuria, and Scr of 2 at baseline.  1. renal US c/w chronic medical disease 2. Serologies and SPEP/UPEP pending. 2. Acute respiratory distress- with initial hypoxia and now respiratory acidosis and hypercapnia. On BiPap. 3. Pulmonary edema/acute decompensated CHF- start IV lasix and follow. Would also check ECHO if not done recently. R/o for MI 4. Malignant HTN/urgent HTN- per cardiology.  Consider renal artery duplex to r/o RAS 1. Still poorly controlled and not on any of her outpt meds.   2. Was on metoprolol and exforge.  Recommend restarting metoprolol and amlodipine but cont to hold ARB for now given AKI. 5. Proteinuria- non-nephrotic by Uprot/creat ratio, awaiting 24 hour collection although may be lower due to AKI.  6. DM- per primary svc. Will need sliding scale. 7. Anemia-  normocytic and new.  Will need to check stool cards and iron stores.     Erubiel Manasco A

## 2013-10-24 NOTE — Progress Notes (Signed)
Pharmacist Heart Failure Core Measure Documentation  Assessment: Daphnie Venturini has an EF documented as 30-35% on echo.  Rationale: Heart failure patients with left ventricular systolic dysfunction (LVSD) and an EF < 40% should be prescribed an angiotensin converting enzyme inhibitor (ACEI) or angiotensin receptor blocker (ARB) at discharge unless a contraindication is documented in the medical record.  This patient is not currently on an ACEI or ARB for HF.  This note is being placed in the record in order to provide documentation that a contraindication to the use of these agents is present for this encounter.  ACE Inhibitor or Angiotensin Receptor Blocker is contraindicated (specify all that apply)  []   ACEI allergy AND ARB allergy []   Angioedema []   Moderate or severe aortic stenosis []   Hyperkalemia []   Hypotension []   Renal artery stenosis [x]   Worsening renal function, preexisting renal disease or dysfunction   [name] 10/24/2013 12:15 PM

## 2013-10-24 NOTE — Progress Notes (Signed)
Subjective:  Elevated blood pressure. No chest pain. Decreasing leg edema and shortness of breath. Afebrile. Patient claims she was told to drink more water. Poor LV systolic function on echocardiogram.  Objective:  Vital Signs in the last 24 hours: Temp:  [97.6 F (36.4 C)-98.6 F (37 C)] 98.5 F (36.9 C) (10/25 0700) Pulse Rate:  [74-106] 84 (10/24 1900) Cardiac Rhythm:  [-] Normal sinus rhythm (10/25 0800) Resp:  [17-40] 21 (10/25 1000) BP: (157-201)/(67-112) 161/103 mmHg (10/25 1000) SpO2:  [70 %-100 %] 99 % (10/25 1000) FiO2 (%):  [50 %] 50 % (10/24 1101) Weight:  [88.451 kg (195 lb)] 88.451 kg (195 lb) (10/24 1300)  Physical Exam: BP Readings from Last 1 Encounters:  10/24/13 161/103     Wt Readings from Last 1 Encounters:  10/23/13 88.451 kg (195 lb)    Weight change:   HEENT: [place]/AT, Eyes-Brown, PERL, EOMI, Conjunctiva-Pale pink, Sclera-Non-icteric Neck: No JVD, No bruit, Trachea midline. Lungs:  Clearing, Bilateral. Cardiac:  Regular rhythm, normal S1 and S2, no S3. II/VI systolic murmur at apex. Pericardial rub heard in supine position. Abdomen:  Soft, non-tender. Extremities:  2 + lower leg edema present. No cyanosis. No clubbing. CNS: AxOx3, Cranial nerves grossly intact, moves all 4 extremities. Right handed. Skin: Warm and dry.   Intake/Output from previous day: 10/24 0701 - 10/25 0700 In: 547.5 [P.O.:120; I.V.:427.5] Out: 2575 [Urine:2575]    Lab Results: BMET    Component Value Date/Time   NA 140 10/24/2013 0405   K 4.0 10/24/2013 0405   CL 102 10/24/2013 0405   CO2 23 10/24/2013 0405   GLUCOSE 125* 10/24/2013 0405   BUN 43* 10/24/2013 0405   CREATININE 2.79* 10/24/2013 0405   CALCIUM 9.1 10/24/2013 0405   GFRNONAA 16* 10/24/2013 0405   GFRAA 19* 10/24/2013 0405   CBC    Component Value Date/Time   WBC 8.6 10/23/2013 1445   RBC 3.19* 10/23/2013 1445   HGB 9.7* 10/23/2013 1445   HCT 28.5* 10/23/2013 1445   PLT 376 10/23/2013 1445   MCV  89.3 10/23/2013 1445   MCH 30.4 10/23/2013 1445   MCHC 34.0 10/23/2013 1445   RDW 15.5 10/23/2013 1445   LYMPHSABS 0.9 10/23/2013 1445   MONOABS 0.4 10/23/2013 1445   EOSABS 0.0 10/23/2013 1445   BASOSABS 0.0 10/23/2013 1445   CARDIAC ENZYMES Lab Results  Component Value Date   CKTOTAL 211* 10/23/2013   TROPONINI <0.30 10/23/2013    Scheduled Meds: . aspirin EC  81 mg Oral Daily  . atorvastatin  20 mg Oral q1800  . furosemide  40 mg Intravenous Q12H  . heparin  5,000 Units Subcutaneous Q8H  . influenza vac split quadrivalent PF  0.5 mL Intramuscular Tomorrow-1000  . insulin aspart  0-9 Units Subcutaneous TID WC  . insulin glargine  10 Units Subcutaneous QHS  . isosorbide-hydrALAZINE  1 tablet Oral TID  . pantoprazole  40 mg Oral Q0600  . pneumococcal 23 valent vaccine  0.5 mL Intramuscular Tomorrow-1000  . potassium chloride  20 mEq Oral BID  . sodium chloride  3 mL Intravenous Q12H   Continuous Infusions: . nitroGLYCERIN 75 mcg/min (10/24/13 0917)   PRN Meds:.sodium chloride, acetaminophen, hydrALAZINE, labetalol, ondansetron (ZOFRAN) IV, sodium chloride  Assessment/Plan: Acute pulmonary edema rule out ischemia/MI  Acute respiratory failure secondary to above  Uncontrolled hypertension  Non-insulin-dependent diabetes mellitus  Acute on chronic kidney disease stage IV  Hypercholesteremia  Diabetic neuropathy  Morbid obesity  Positive family history of coronary artery  disease Pericarditis/Pericardial effusion  Add Bidil.  Will wean off NTG drip soon. Patient understood to eat less salt/food and drink less water.   LOS: 1 day    Orpah Cobb  MD  10/24/2013, 10:39 AM

## 2013-10-25 LAB — RENAL FUNCTION PANEL
Albumin: 2.3 g/dL — ABNORMAL LOW (ref 3.5–5.2)
BUN: 43 mg/dL — ABNORMAL HIGH (ref 6–23)
CO2: 24 mEq/L (ref 19–32)
Calcium: 8.5 mg/dL (ref 8.4–10.5)
Creatinine, Ser: 3.07 mg/dL — ABNORMAL HIGH (ref 0.50–1.10)
GFR calc Af Amer: 17 mL/min — ABNORMAL LOW (ref >90)
Phosphorus: 4.1 mg/dL (ref 2.3–4.6)
Potassium: 3.9 mEq/L (ref 3.5–5.1)
Sodium: 140 mEq/L (ref 135–145)

## 2013-10-25 LAB — PROTEIN, URINE, 24 HOUR: Protein, 24H Urine: 5758 mg/d — ABNORMAL HIGH (ref 50–100)

## 2013-10-25 LAB — GLUCOSE, CAPILLARY: Glucose-Capillary: 125 mg/dL — ABNORMAL HIGH (ref 70–99)

## 2013-10-25 MED ORDER — METOPROLOL TARTRATE 25 MG PO TABS
25.0000 mg | ORAL_TABLET | Freq: Two times a day (BID) | ORAL | Status: DC
  Administered 2013-10-25 – 2013-10-26 (×3): 25 mg via ORAL
  Filled 2013-10-25 (×4): qty 1

## 2013-10-25 MED ORDER — ALBUMIN HUMAN 25 % IV SOLN
12.5000 g | Freq: Once | INTRAVENOUS | Status: AC
  Administered 2013-10-25: 12.5 g via INTRAVENOUS
  Filled 2013-10-25: qty 50

## 2013-10-25 MED ORDER — FUROSEMIDE 80 MG PO TABS
80.0000 mg | ORAL_TABLET | Freq: Two times a day (BID) | ORAL | Status: DC
  Administered 2013-10-25 – 2013-10-27 (×5): 80 mg via ORAL
  Filled 2013-10-25 (×8): qty 1

## 2013-10-25 MED ORDER — CIPROFLOXACIN HCL 250 MG PO TABS
250.0000 mg | ORAL_TABLET | Freq: Two times a day (BID) | ORAL | Status: DC
  Administered 2013-10-25 – 2013-10-27 (×4): 250 mg via ORAL
  Filled 2013-10-25 (×8): qty 1

## 2013-10-25 NOTE — Progress Notes (Signed)
Patient ID: [name], female   DOB: 1945/11/10, 68 y.o.   MRN: [id number] S:c/o HA today O:BP 173/81  Pulse 84  Temp(Src) 98.9 F (37.2 C) (Oral)  Resp 22  Ht 5' (1.524 m)  Wt 88.451 kg (195 lb)  BMI 38.08 kg/m2  SpO2 99%  Intake/Output Summary (Last 24 hours) at 10/25/13 0930 Last data filed at 10/25/13 0823  Gross per 24 hour  Intake 1477.63 ml  Output   1950 ml  Net -472.37 ml   Intake/Output: I/O last 3 completed shifts: In: 2265.9 [P.O.:1240; I.V.:1025.9] Out: 3350 [Urine:3350]  Intake/Output this shift:  Total I/O In: 3 [I.V.:3] Out: 100 [Urine:100] Weight change:  Gen:WD obese AAF in NAD CVS:no rub Resp:cta BJY:NWGNFA Ext:+1 edema b/l lower ext   Recent Labs Lab 10/23/13 1052 10/23/13 1445 10/24/13 0405 10/25/13 0422  NA 139 143 140 140  K 4.2 4.5 4.0 3.9  CL 103 103 102 104  CO2  --  28 23 24   GLUCOSE 335* 254* 125* 126*  BUN 55* 43* 43* 43*  CREATININE 3.00* 2.85* 2.79* 3.07*  ALBUMIN  --  2.9* 2.5* 2.3*  CALCIUM  --  9.7 9.1 8.5  PHOS  --   --  4.8* 4.1  AST  --  26  --   --   ALT  --  25  --   --    Liver Function Tests:  Recent Labs Lab 10/23/13 1445 10/24/13 0405 10/25/13 0422  AST 26  --   --   ALT 25  --   --   ALKPHOS 95  --   --   BILITOT 0.2*  --   --   PROT 7.1  --   --   ALBUMIN 2.9* 2.5* 2.3*   No results found for this basename: LIPASE, AMYLASE,  in the last 168 hours No results found for this basename: AMMONIA,  in the last 168 hours CBC:  Recent Labs Lab 10/23/13 1040 10/23/13 1052 10/23/13 1445  WBC 12.3*  --  8.6  NEUTROABS  --   --  7.3  HGB 10.9* 11.9* 9.7*  HCT 31.9* 35.0* 28.5*  MCV 88.6  --  89.3  PLT 439*  --  376   Cardiac Enzymes:  Recent Labs Lab 10/23/13 1445 10/23/13 1545 10/23/13 2134 10/24/13 1035  CKTOTAL  --  211*  --   --   TROPONINI <0.30  --  <0.30 <0.30   CBG:  Recent Labs Lab 10/23/13 2143 10/24/13 0801 10/24/13 1045 10/24/13 1525 10/24/13 2114  GLUCAP 230* 89 157*  184* 224*    Iron Studies:  Recent Labs  10/24/13 1035  IRON 14*  TIBC 218*  FERRITIN 126   Studies/Results: US Renal  10/23/2013   CLINICAL DATA:  Acute on chronic renal failure  EXAM: RENAL/URINARY TRACT ULTRASOUND COMPLETE  COMPARISON:  CT abdomen pelvis dated 06/10/2011  FINDINGS: Right Kidney  Length: 10.8 cm. Echogenic renal parenchyma, suggesting medical renal disease. No mass or hydronephrosis.  Left Kidney  Length: 10.8 cm. Echogenic renal parenchyma, suggesting medical renal disease. No mass or hydronephrosis.  Bladder  Within normal limits. Indwelling Foley catheter.  IMPRESSION: No hydronephrosis.  Echogenic renal parenchyma, suggesting medical renal disease.  Foley catheter within the bladder.   Electronically Signed   By: Charline Bills M.D.   On: 10/23/2013 16:08   Dg Chest Portable 1 View  10/23/2013   CLINICAL DATA:  Respiratory distress  EXAM: PORTABLE CHEST - 1 VIEW  COMPARISON:  Prior chest x-ray 10/14/2010  FINDINGS: Bilateral interstitial and airspace opacities in the mid and lower lungs in Christian predominantly perihilar and lower lobe distribution. Slightly enlarged cardiopericardial silhouette. Atherosclerotic calcifications are noted in the transverse aorta. Surgical changes of prior right shoulder arthroplasty. 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 No pneumothorax. No acute osseous abnormality.  IMPRESSION: Imaging findings are most suggestive of mild-moderate CHF.  Multifocal pneumonia could have Christian similar appearance in the appropriate clinical setting.   Electronically Signed   By: Malachy Moan M.D.   On: 10/23/2013 10:51   . albumin human  12.5 g Intravenous Once  . aspirin EC  81 mg Oral Daily  . atorvastatin  20 mg Oral q1800  . furosemide  80 mg Oral BID  . heparin  5,000 Units Subcutaneous Q8H  . influenza vac split quadrivalent PF  0.5 mL Intramuscular Tomorrow-1000  . insulin aspart  0-9 Units  Subcutaneous TID WC  . insulin glargine  10 Units Subcutaneous QHS  . isosorbide-hydrALAZINE  1 tablet Oral TID  . pantoprazole  40 mg Oral Q0600  . pneumococcal 23 valent vaccine  0.5 mL Intramuscular Tomorrow-1000  . potassium chloride  20 mEq Oral BID  . sodium chloride  3 mL Intravenous Q12H    BMET    Component Value Date/Time   NA 140 10/25/2013 0422   K 3.9 10/25/2013 0422   CL 104 10/25/2013 0422   CO2 24 10/25/2013 0422   GLUCOSE 126* 10/25/2013 0422   BUN 43* 10/25/2013 0422   CREATININE 3.07* 10/25/2013 0422   CALCIUM 8.5 10/25/2013 0422   GFRNONAA 14* 10/25/2013 0422   GFRAA 17* 10/25/2013 0422   CBC    Component Value Date/Time   WBC 8.6 10/23/2013 1445   RBC 3.19* 10/23/2013 1445   HGB 9.7* 10/23/2013 1445   HCT 28.5* 10/23/2013 1445   PLT 376 10/23/2013 1445   MCV 89.3 10/23/2013 1445   MCH 30.4 10/23/2013 1445   MCHC 34.0 10/23/2013 1445   RDW 15.5 10/23/2013 1445   LYMPHSABS 0.9 10/23/2013 1445   MONOABS 0.4 10/23/2013 1445   EOSABS 0.0 10/23/2013 1445   BASOSABS 0.0 10/23/2013 1445     Impression:  1. Acute respiratory distress 2. Hypercapnic respiratory acidosis 3. AKI/CKD 4. Malignant HTN 5. Acute decompensated CHF 6. DM 7. Normocytic anemia  Assessment/Plan:  1. AKI/CKD- non-oliguric. pt with malignant HTN, acute decompensated CHF in setting of ARB. Has never seen nephrology but has h/o CKD, longstanding proteinuria, and Scr of 2 at baseline.  1. renal US c/w chronic medical disease 2. Serologies and SPEP/UPEP pending. 2. Acute respiratory distress- resolved 3. Pulmonary edema/acute decompensated CHF- start IV lasix and follow.  1. ECHO revealed severe LV dysfunction (EF30-35%) and ventricular septal motion abnormality. Unknown baseline EF.  2. Still with peripheral edema and responding to Lasix. 4. Malignant HTN/urgent HTN- per cardiology. Consider renal artery duplex to r/o RAS  1. Still poorly controlled and not on any of her outpt  meds.  2. Was on metoprolol and exforge. Recommend restarting metoprolol and/or amlodipine yesterday but ordered.  Started on Bidil.  Will start metoprolol today and cont to hold ARB for now given AKI. 5. Proteinuria- non-nephrotic by Uprot/creat ratio, awaiting 24 hour collection although may be lower due to AKI.  6. DM- per primary svc. Will need sliding scale. 7. Anemia- normocytic and new. Will need to check stool cards and iron stores. 8.   [name]

## 2013-10-25 NOTE — Progress Notes (Signed)
Subjective:  No leg edema. Feeling better.  Objective:  Vital Signs in the last 24 hours: Temp:  [98.8 F (37.1 C)-99.3 F (37.4 C)] 98.9 F (37.2 C) (10/26 0400) Cardiac Rhythm:  [-] Normal sinus rhythm (10/26 0400) Resp:  [20-38] 22 (10/26 0600) BP: (136-189)/(59-103) 173/81 mmHg (10/26 0600) SpO2:  [96 %-100 %] 99 % (10/26 0600)  Physical Exam: BP Readings from Last 1 Encounters:  10/25/13 173/81     Wt Readings from Last 1 Encounters:  10/23/13 88.451 kg (195 lb)    Weight change:   HEENT: [place]/AT, Eyes-Brown, PERL, EOMI, Conjunctiva-Pale pink, Sclera-Non-icteric Neck: No JVD, No bruit, Trachea midline. Lungs:  Clear, Bilateral. Cardiac:  Regular rhythm, normal S1 and S2, no S3. II/VI systolic murmur. Abdomen:  Soft, non-tender. Extremities:  No edema present. No cyanosis. No clubbing. CNS: AxOx3, Cranial nerves grossly intact, moves all 4 extremities. Right handed. Skin: Warm and dry.   Intake/Output from previous day: 10/25 0701 - 10/26 0700 In: 1848.9 [P.O.:1120; I.V.:728.9] Out: 2100 [Urine:2100]    Lab Results: BMET    Component Value Date/Time   NA 140 10/25/2013 0422   K 3.9 10/25/2013 0422   CL 104 10/25/2013 0422   CO2 24 10/25/2013 0422   GLUCOSE 126* 10/25/2013 0422   BUN 43* 10/25/2013 0422   CREATININE 3.07* 10/25/2013 0422   CALCIUM 8.5 10/25/2013 0422   GFRNONAA 14* 10/25/2013 0422   GFRAA 17* 10/25/2013 0422   CBC    Component Value Date/Time   WBC 8.6 10/23/2013 1445   RBC 3.19* 10/23/2013 1445   HGB 9.7* 10/23/2013 1445   HCT 28.5* 10/23/2013 1445   PLT 376 10/23/2013 1445   MCV 89.3 10/23/2013 1445   MCH 30.4 10/23/2013 1445   MCHC 34.0 10/23/2013 1445   RDW 15.5 10/23/2013 1445   LYMPHSABS 0.9 10/23/2013 1445   MONOABS 0.4 10/23/2013 1445   EOSABS 0.0 10/23/2013 1445   BASOSABS 0.0 10/23/2013 1445   CARDIAC ENZYMES Lab Results  Component Value Date   CKTOTAL 211* 10/23/2013   TROPONINI <0.30 10/24/2013    Scheduled  Meds: . albumin human  12.5 g Intravenous Once  . aspirin EC  81 mg Oral Daily  . atorvastatin  20 mg Oral q1800  . furosemide  80 mg Oral BID  . heparin  5,000 Units Subcutaneous Q8H  . influenza vac split quadrivalent PF  0.5 mL Intramuscular Tomorrow-1000  . insulin aspart  0-9 Units Subcutaneous TID WC  . insulin glargine  10 Units Subcutaneous QHS  . isosorbide-hydrALAZINE  1 tablet Oral TID  . pantoprazole  40 mg Oral Q0600  . pneumococcal 23 valent vaccine  0.5 mL Intramuscular Tomorrow-1000  . potassium chloride  20 mEq Oral BID  . sodium chloride  3 mL Intravenous Q12H   Continuous Infusions: . nitroGLYCERIN 70 mcg/min (10/25/13 0500)   PRN Meds:.sodium chloride, acetaminophen, hydrALAZINE, labetalol, ondansetron (ZOFRAN) IV, sodium chloride  Assessment/Plan: Acute pulmonary edema rule out ischemia/MI  Acute respiratory failure secondary to above  Uncontrolled hypertension  Non-insulin-dependent diabetes mellitus  Acute on chronic kidney disease stage IV  Hypercholesteremia  Diabetic neuropathy  Morbid obesity  Positive family history of coronary artery disease  Pericarditis/Pericardial effusion  DC Foley cath. Change IV Lasix to PO.   LOS: 2 days    Orpah Cobb  MD  10/25/2013, 8:38 AM

## 2013-10-26 LAB — GLUCOSE, CAPILLARY
Glucose-Capillary: 168 mg/dL — ABNORMAL HIGH (ref 70–99)
Glucose-Capillary: 188 mg/dL — ABNORMAL HIGH (ref 70–99)

## 2013-10-26 LAB — CBC WITH DIFFERENTIAL/PLATELET
Basophils Relative: 0 % (ref 0–1)
Eosinophils Absolute: 0.3 10*3/uL (ref 0.0–0.7)
Eosinophils Relative: 4 % (ref 0–5)
HCT: 22 % — ABNORMAL LOW (ref 36.0–46.0)
Hemoglobin: 7.5 g/dL — ABNORMAL LOW (ref 12.0–15.0)
Lymphs Abs: 2.1 10*3/uL (ref 0.7–4.0)
MCHC: 34.1 g/dL (ref 30.0–36.0)
Monocytes Relative: 12 % (ref 3–12)
Neutrophils Relative %: 56 % (ref 43–77)
Platelets: 349 10*3/uL (ref 150–400)
WBC: 7.2 10*3/uL (ref 4.0–10.5)

## 2013-10-26 LAB — COMPREHENSIVE METABOLIC PANEL
AST: 20 U/L (ref 0–37)
Albumin: 2.5 g/dL — ABNORMAL LOW (ref 3.5–5.2)
Alkaline Phosphatase: 69 U/L (ref 39–117)
BUN: 47 mg/dL — ABNORMAL HIGH (ref 6–23)
BUN: 46 mg/dL — ABNORMAL HIGH (ref 6–23)
CO2: 27 mEq/L (ref 19–32)
CO2: 22 mEq/L (ref 19–32)
Calcium: 9 mg/dL (ref 8.4–10.5)
Calcium: 8.6 mg/dL (ref 8.4–10.5)
Creatinine, Ser: 3.17 mg/dL — ABNORMAL HIGH (ref 0.50–1.10)
GFR calc Af Amer: 15 mL/min — ABNORMAL LOW (ref >90)
GFR calc non Af Amer: 14 mL/min — ABNORMAL LOW (ref >90)
GFR calc non Af Amer: 13 mL/min — ABNORMAL LOW (ref >90)
Glucose, Bld: 169 mg/dL — ABNORMAL HIGH (ref 70–99)
Potassium: 4.5 mEq/L (ref 3.5–5.1)
Total Bilirubin: 0.2 mg/dL — ABNORMAL LOW (ref 0.3–1.2)
Total Protein: 6.3 g/dL (ref 6.0–8.3)

## 2013-10-26 LAB — URINE CULTURE

## 2013-10-26 LAB — CBC
HCT: 22.5 % — ABNORMAL LOW (ref 36.0–46.0)
Hemoglobin: 7.7 g/dL — ABNORMAL LOW (ref 12.0–15.0)
MCH: 30.9 pg (ref 26.0–34.0)
MCV: 90.4 fL (ref 78.0–100.0)
Platelets: 335 10*3/uL (ref 150–400)
RBC: 2.49 MIL/uL — ABNORMAL LOW (ref 3.87–5.11)
WBC: 7.6 10*3/uL (ref 4.0–10.5)

## 2013-10-26 LAB — PREPARE RBC (CROSSMATCH)

## 2013-10-26 MED ORDER — FERROUS SULFATE 325 (65 FE) MG PO TABS
325.0000 mg | ORAL_TABLET | Freq: Three times a day (TID) | ORAL | Status: DC
  Administered 2013-10-26 – 2013-10-27 (×3): 325 mg via ORAL
  Filled 2013-10-26 (×5): qty 1

## 2013-10-26 MED ORDER — CARVEDILOL 25 MG PO TABS
25.0000 mg | ORAL_TABLET | Freq: Two times a day (BID) | ORAL | Status: DC
  Administered 2013-10-26 – 2013-10-27 (×2): 25 mg via ORAL
  Filled 2013-10-26 (×5): qty 1

## 2013-10-26 MED ORDER — LATANOPROST 0.005 % OP SOLN
1.0000 [drp] | Freq: Every day | OPHTHALMIC | Status: DC
  Administered 2013-10-26: 1 [drp] via OPHTHALMIC
  Filled 2013-10-26: qty 2.5

## 2013-10-26 MED ORDER — FUROSEMIDE 10 MG/ML IJ SOLN
40.0000 mg | Freq: Once | INTRAMUSCULAR | Status: AC
  Administered 2013-10-27: 40 mg via INTRAVENOUS

## 2013-10-26 MED ORDER — METOPROLOL TARTRATE 50 MG PO TABS
50.0000 mg | ORAL_TABLET | Freq: Two times a day (BID) | ORAL | Status: DC
  Filled 2013-10-26: qty 1

## 2013-10-26 NOTE — Progress Notes (Signed)
  Assessment/Plan:  1. AKI/CKD- non-oliguric. pt with malignant HTN, acute decompensated CHF in setting of ARB. Cardiorenal syndrome. Has h/o CKD, longstanding proteinuria, and Scr of 2 at baseline.  Serologies and SPEP/UPEP pending. 2.   Acute respiratory distress- resolved Pulmonary edema/acute decompensated CHF- on PO Furosemide and Bidil 3.   Malignant HTN/urgent HTN- increase metoprolol to 50mg  BID.  4.   Proteinuria- non-nephrotic by Uprot/creat ratio, awaiting 24 hour collection although may be lower due to AKI.  5.   Anemia- normocytic, iron deficient. Check stool cards and give iron.  Subjective: Interval History: none Objective: Vital signs in last 24 hours: Temp:  [98.2 F (36.8 C)-99.1 F (37.3 C)] 99.1 F (37.3 C) (10/27 0430) Pulse Rate:  [93-98] 93 (10/26 2112) Resp:  [17-26] 18 (10/27 0600) BP: (139-210)/(65-126) 194/86 mmHg (10/27 0600) SpO2:  [95 %-100 %] 100 % (10/27 0600) Weight change:   Intake/Output from previous day: 10/26 0701 - 10/27 0700 In: 778.9 [P.O.:400; I.V.:328.9; IV Piggyback:50] Out: 1102 [Urine:1101; Stool:1] Intake/Output this shift: Total I/O In: 251.5 [P.O.:240; I.V.:11.5] Out: 75 [Urine:75]  General appearance: alert and cooperative Resp: clear to auscultation bilaterally Cardio: regular rate and rhythm, S1, S2 normal, no murmur, click, rub or gallop Extremities: edema 1+  Lab Results:  Recent Labs  10/23/13 1445 10/26/13 0420  WBC 8.6 7.6  HGB 9.7* 7.7*  HCT 28.5* 22.5*  PLT 376 335   BMET:  Recent Labs  10/25/13 0422 10/26/13 0420  NA 140 137  K 3.9 4.5  CL 104 100  CO2 24 27  GLUCOSE 126* 68*  BUN 43* 47*  CREATININE 3.07* 3.17*  CALCIUM 8.5 9.0   No results found for this basename: PTH,  in the last 72 hours Iron Studies:  Recent Labs  10/24/13 1035  IRON 14*  TIBC 218*  FERRITIN 126   Studies/Results: No results found.  Scheduled: . aspirin EC  81 mg Oral Daily  . atorvastatin  20 mg Oral q1800  .  ciprofloxacin  250 mg Oral BID  . furosemide  80 mg Oral BID  . heparin  5,000 Units Subcutaneous Q8H  . influenza vac split quadrivalent PF  0.5 mL Intramuscular Tomorrow-1000  . insulin aspart  0-9 Units Subcutaneous TID WC  . insulin glargine  10 Units Subcutaneous QHS  . isosorbide-hydrALAZINE  1 tablet Oral TID  . metoprolol tartrate  25 mg Oral BID  . pantoprazole  40 mg Oral Q0600  . potassium chloride  20 mEq Oral BID  . sodium chloride  3 mL Intravenous Q12H    LOS: 3 days   Cosmo Tetreault C 10/26/2013,11:42 AM

## 2013-10-26 NOTE — Progress Notes (Signed)
Subjective:  Patient denies any chest pain states breathing has improved. Denies any black tarry stools or hematemesis or hemoptysis. Or bright red blood per rectum.  Objective:  Vital Signs in the last 24 hours: Temp:  [98.2 F (36.8 C)-99.1 F (37.3 C)] 99.1 F (37.3 C) (10/27 0430) Pulse Rate:  [93-98] 93 (10/26 2112) Resp:  [17-26] 18 (10/27 0600) BP: (139-210)/(65-126) 194/86 mmHg (10/27 0600) SpO2:  [95 %-100 %] 100 % (10/27 0600)  Intake/Output from previous day: 10/26 0701 - 10/27 0700 In: 778.9 [P.O.:400; I.V.:328.9; IV Piggyback:50] Out: 1102 [Urine:1101; Stool:1] Intake/Output from this shift: Total I/O In: 251.5 [P.O.:240; I.V.:11.5] Out: 75 [Urine:75]  Physical Exam: Neck: no adenopathy, no carotid bruit, no JVD and supple, symmetrical, trachea midline Lungs: Decreased breath sound at bases air entry improved Heart: regular rate and rhythm, S1, S2 normal and Soft systolic murmur noted Abdomen: soft, non-tender; bowel sounds normal; no masses,  no organomegaly Extremities: extremities normal, atraumatic, no cyanosis or edema  Lab Results:  Recent Labs  10/23/13 1445 10/26/13 0420  WBC 8.6 7.6  HGB 9.7* 7.7*  PLT 376 335    Recent Labs  10/25/13 0422 10/26/13 0420  NA 140 137  K 3.9 4.5  CL 104 100  CO2 24 27  GLUCOSE 126* 68*  BUN 43* 47*  CREATININE 3.07* 3.17*    Recent Labs  10/23/13 2134 10/24/13 1035  TROPONINI <0.30 <0.30   Hepatic Function Panel  Recent Labs  10/26/13 0420  PROT 6.4  ALBUMIN 2.5*  AST 20  ALT 17  ALKPHOS 71  BILITOT 0.2*   No results found for this basename: CHOL,  in the last 72 hours No results found for this basename: PROTIME,  in the last 72 hours  Imaging: Imaging results have been reviewed and No results found.  Cardiac Studies:  Assessment/Plan:  Status post acute pulmonary edema secondary to depressed LV systolic function Status post acute respiratory failure Uncontrolled  hypertension Non-insulin-dependent diabetes mellitus Acute on chronic kidney disease stage IV Acute on chronic anemia rule out GI loss Hypercholesteremia Diabetic nephropathy Diabetic neuropathy Morbid obesity Family history of coronary artery disease Plan Change metoprolol tartrate at all as per orders Check stool for occult blood Check CBC again Transfer to telemetry Home soon if not planned for vascular access for dialysis  LOS: 3 days    Nevaya Nagele N 10/26/2013, 11:54 AM

## 2013-10-26 NOTE — Progress Notes (Signed)
Physical Therapy Evaluation Patient Details Name: [name] MRN: [id number] DOB: [birth year] Today's Date: 10/26/2013 Time: 8295-6213 PT Time Calculation (min): 15 min  PT Assessment / Plan / Recommendation History of Present Illness  Pt admit with pulmonary edema and r/o MI.    Clinical Impression  Pt admitted with above. Pt currently with functional limitations due to the deficits listed below (see PT Problem List). Pt should progress to home with HHPT.  Daughter can assist at home prn.   Pt will benefit from skilled PT to increase their independence and safety with mobility to allow discharge to the venue listed below.     PT Assessment  Patient needs continued PT services    Follow Up Recommendations  Home health PT;Supervision/Assistance - 24 hour                Equipment Recommendations  None recommended by PT         Frequency Min 3X/week    Precautions / Restrictions Precautions Precautions: Fall Restrictions Weight Bearing Restrictions: No   Pertinent Vitals/Pain Desat to 88% on RA with ambulation.  94% on RA at rest; HR WNL.  No pain      Mobility  Bed Mobility Bed Mobility: Supine to Sit Supine to Sit: 5: Supervision Transfers Transfers: Sit to Stand;Stand to Sit Sit to Stand: 4: Min guard;With upper extremity assist;From bed Stand to Sit: 4: Min guard;With upper extremity assist;To chair/3-in-1;With armrests Details for Transfer Assistance: Steadying assist needed initially.  Ambulation/Gait Ambulation/Gait Assistance: 4: Min assist Ambulation Distance (Feet): 120 Feet Assistive device: None Ambulation/Gait Assistance Details: Pt staggering at times.  Pt needed steadying assist occasionally.   Gait Pattern: Decreased step length - right;Decreased step length - left;Narrow base of support;Scissoring Gait velocity: decreased Stairs: No Wheelchair Mobility Wheelchair Mobility: No         PT Diagnosis: Generalized weakness  PT Problem List:  Decreased activity tolerance;Decreased balance;Decreased mobility;Decreased knowledge of precautions;Decreased safety awareness;Decreased knowledge of use of DME PT Treatment Interventions: DME instruction;Gait training;Functional mobility training;Therapeutic activities;Therapeutic exercise;Balance training;Patient/family education;Stair training     PT Goals(Current goals can be found in the care plan section) Acute Rehab PT Goals Patient Stated Goal: to go home PT Goal Formulation: With patient Time For Goal Achievement: 11/02/13 Potential to Achieve Goals: Good  Visit Information  Last PT Received On: 10/26/13 Assistance Needed: +1 History of Present Illness: Pt admit with pulmonary edema and r/o MI.         Prior Functioning  Home Living Family/patient expects to be discharged to:: Private residence Living Arrangements: Alone Available Help at Discharge: Family;Available 24 hours/day Type of Home: House Home Access: Stairs to enter [employer] of Steps: 2 Entrance Stairs-Rails: None Home Layout: One level Home Equipment:  (Pt thinks she has some equipment) Additional Comments: States daughter can assist her prn at home Prior Function Level of Independence: Independent Communication Communication: No difficulties    Cognition  Cognition Arousal/Alertness: Awake/alert Behavior During Therapy: WFL for tasks assessed/performed Overall Cognitive Status: Within Functional Limits for tasks assessed    Extremity/Trunk Assessment Upper Extremity Assessment Upper Extremity Assessment: Defer to OT evaluation Lower Extremity Assessment Lower Extremity Assessment: Generalized weakness Cervical / Trunk Assessment Cervical / Trunk Assessment: Normal   Balance Static Standing Balance Static Standing - Balance Support: During functional activity;No upper extremity supported Static Standing - Level of Assistance: 4: Min assist Static Standing - Comment/# of Minutes:  Needs steadying assist 2 minutes static stance  End of Session PT -  End of Session Equipment Utilized During Treatment: Gait belt Activity Tolerance: Patient limited by fatigue Patient left: in chair;with call bell/phone within reach Nurse Communication: Mobility status       INGOLD,Deari Sessler 10/26/2013, 2:40 PM  [facility] Acute [facility] [phone] (pager)

## 2013-10-27 LAB — UIFE/LIGHT CHAINS/TP QN, 24-HR UR
Albumin, U: DETECTED
Alpha 1, Urine: DETECTED — AB
Free Kappa/Lambda Ratio: 6.57 ratio (ref 2.04–10.37)
Gamma Globulin, Urine: DETECTED — AB
Total Protein, Urine: 169.2 mg/dL

## 2013-10-27 LAB — PROTEIN ELECTROPHORESIS, SERUM
Alpha-2-Globulin: 19.9 % — ABNORMAL HIGH (ref 7.1–11.8)
Beta Globulin: 6.7 % (ref 4.7–7.2)
M-Spike, %: NOT DETECTED g/dL
Total Protein ELP: 6.3 g/dL (ref 6.0–8.3)

## 2013-10-27 LAB — RENAL FUNCTION PANEL
Albumin: 2.5 g/dL — ABNORMAL LOW (ref 3.5–5.2)
BUN: 48 mg/dL — ABNORMAL HIGH (ref 6–23)
CO2: 22 mEq/L (ref 19–32)
Calcium: 8.8 mg/dL (ref 8.4–10.5)
Chloride: 97 mEq/L (ref 96–112)
Creatinine, Ser: 3.44 mg/dL — ABNORMAL HIGH (ref 0.50–1.10)
GFR calc non Af Amer: 13 mL/min — ABNORMAL LOW (ref >90)
Potassium: 4 mEq/L (ref 3.5–5.1)

## 2013-10-27 LAB — GLUCOSE, CAPILLARY
Glucose-Capillary: 111 mg/dL — ABNORMAL HIGH (ref 70–99)
Glucose-Capillary: 142 mg/dL — ABNORMAL HIGH (ref 70–99)

## 2013-10-27 LAB — ABO/RH: ABO/RH(D): A POS

## 2013-10-27 MED ORDER — FERROUS SULFATE 325 (65 FE) MG PO TABS: 325.0000 mg | ORAL_TABLET | Freq: Three times a day (TID) | ORAL | Status: DC

## 2013-10-27 MED ORDER — CIPROFLOXACIN HCL 250 MG PO TABS: 250.0000 mg | ORAL_TABLET | Freq: Two times a day (BID) | ORAL | Status: DC

## 2013-10-27 MED ORDER — ISOSORB DINITRATE-HYDRALAZINE 20-37.5 MG PO TABS: 1.0000 | ORAL_TABLET | Freq: Three times a day (TID) | ORAL | Status: AC

## 2013-10-27 MED ORDER — CARVEDILOL 25 MG PO TABS: 25.0000 mg | ORAL_TABLET | Freq: Two times a day (BID) | ORAL | Status: DC

## 2013-10-27 MED ORDER — FUROSEMIDE 80 MG PO TABS: 80.0000 mg | ORAL_TABLET | Freq: Two times a day (BID) | ORAL | Status: AC

## 2013-10-27 MED ORDER — INSULIN GLARGINE 100 UNIT/ML ~~LOC~~ SOLN: 10.0000 [IU] | Freq: Every day | SUBCUTANEOUS | Status: DC

## 2013-10-27 NOTE — Progress Notes (Signed)
Pt discharging home with family.  All instructions given and reviewed.  Pt declines home health per case manager.

## 2013-10-27 NOTE — Discharge Summary (Signed)
  With discharge summary dictated on 10/27/2013 dictation number is [id number]

## 2013-10-27 NOTE — Progress Notes (Signed)
[facility] ROUNDING NOTE   Subjective:   Interval History: waiting to go home  Objective:  Vital signs in last 24 hours:  Temp:  [98.4 F (36.9 C)-99.6 F (37.6 C)] 98.7 F (37.1 C) (10/28 0514) Pulse Rate:  [74-93] 74 (10/28 1035) Resp:  [17-24] 20 (10/28 0514) BP: (137-168)/(50-74) 142/50 mmHg (10/28 1035) SpO2:  [93 %-98 %] 96 % (10/28 0514) Weight:  [83.598 kg (184 lb 4.8 oz)] 83.598 kg (184 lb 4.8 oz) (10/28 0514)  Weight change:  Filed Weights   10/23/13 1300 10/27/13 0514  Weight: 88.451 kg (195 lb) 83.598 kg (184 lb 4.8 oz)    Intake/Output: I/O last 3 completed shifts: In: 1677.4 [P.O.:1240; I.V.:112.4; Blood:325] Out: 777 [Urine:776; Stool:1]   Intake/Output this shift:     CVS- RRR RS- CTA ABD- BS present soft non-distended EXT- trace edema   Basic Metabolic Panel:  Recent Labs Lab 10/23/13 1052  10/23/13 1445 10/24/13 0405 10/25/13 0422 10/26/13 0420 10/26/13 1550 10/27/13 0413  NA 139  --  143 140 140 137 131* 135  K 4.2  --  4.5 4.0 3.9 4.5 4.5 4.0  CL 103  --  103 102 104 100 95* 97  CO2  --   < > 28 23 24 27 22 22   GLUCOSE 335*  --  254* 125* 126* 68* 169* 148*  BUN 55*  --  43* 43* 43* 47* 46* 48*  CREATININE 3.00*  --  2.85* 2.79* 3.07* 3.17* 3.46* 3.44*  CALCIUM  --   < > 9.7 9.1 8.5 9.0 8.6 8.8  MG  --   --  1.8  --   --   --   --   --   PHOS  --   --   --  4.8* 4.1  --   --  4.9*  < > = values in this interval not displayed.  Liver Function Tests:  Recent Labs Lab 10/23/13 1445 10/24/13 0405 10/25/13 0422 10/26/13 0420 10/26/13 1550 10/27/13 0413  AST 26  --   --  20 19  --   ALT 25  --   --  17 16  --   ALKPHOS 95  --   --  71 69  --   BILITOT 0.2*  --   --  0.2* 0.2*  --   PROT 7.1  --   --  6.4 6.3  --   ALBUMIN 2.9* 2.5* 2.3* 2.5* 2.4* 2.5*   No results found for this basename: LIPASE, AMYLASE,  in the last 168 hours No results found for this basename: AMMONIA,  in the last 168  hours  CBC:  Recent Labs Lab 10/23/13 1040 10/23/13 1052 10/23/13 1445 10/26/13 0420 10/26/13 1550 10/27/13 0413  WBC 12.3*  --  8.6 7.6 7.2  --   NEUTROABS  --   --  7.3  --  4.0  --   HGB 10.9* 11.9* 9.7* 7.7* 7.5* 9.0*  HCT 31.9* 35.0* 28.5* 22.5* 22.0* 26.2*  MCV 88.6  --  89.3 90.4 88.7  --   PLT 439*  --  376 335 349  --     Cardiac Enzymes:  Recent Labs Lab 10/23/13 1445 10/23/13 1545 10/23/13 2134 10/24/13 1035  CKTOTAL  --  211*  --   --   TROPONINI <0.30  --  <0.30 <0.30    BNP: No components found with this basename: POCBNP,   CBG:  Recent Labs Lab 10/26/13 1121 10/26/13 1743 10/26/13  2129 10/27/13 0630 10/27/13 1113  GLUCAP 168* 188* 141* 111* 142*    Microbiology: Results for orders placed during the hospital encounter of 10/23/13  MRSA PCR SCREENING     Status: None   Collection Time    10/23/13 12:48 PM      Result Value Range Status   MRSA by PCR NEGATIVE  NEGATIVE Final   Comment:            The GeneXpert MRSA Assay (FDA     approved for NASAL specimens     only), is one component of a     comprehensive MRSA colonization     surveillance program. It is not     intended to diagnose MRSA     infection nor to guide or     monitor treatment for     MRSA infections.  URINE CULTURE     Status: None   Collection Time    10/23/13  4:00 PM      Result Value Range Status   Specimen Description URINE, CLEAN CATCH   Final   Special Requests NONE   Final   Culture  Setup Time     Final   Value: 10/24/2013 01:05     Performed at [facility] Count     Final   Value: >=100,000 COLONIES/ML     Performed at [facility]   Culture     Final   Value: ESCHERICHIA COLI     Performed at [facility]   Report Status 10/26/2013 FINAL   Final   Organism ID, Bacteria ESCHERICHIA COLI   Final    Coagulation Studies: No results found for this basename: LABPROT, INR,  in the last 72 hours  Urinalysis: No  results found for this basename: COLORURINE, APPERANCEUR, LABSPEC, PHURINE, GLUCOSEU, HGBUR, BILIRUBINUR, KETONESUR, PROTEINUR, UROBILINOGEN, NITRITE, LEUKOCYTESUR,  in the last 72 hours    Imaging: No results found.   Medications:   . nitroGLYCERIN Stopped (10/26/13 1000)   . aspirin EC  81 mg Oral Daily  . atorvastatin  20 mg Oral q1800  . carvedilol  25 mg Oral BID WC  . ciprofloxacin  250 mg Oral BID  . ferrous sulfate  325 mg Oral TID WC  . furosemide  80 mg Oral BID  . heparin  5,000 Units Subcutaneous Q8H  . influenza vac split quadrivalent PF  0.5 mL Intramuscular Tomorrow-1000  . insulin aspart  0-9 Units Subcutaneous TID WC  . insulin glargine  10 Units Subcutaneous QHS  . isosorbide-hydrALAZINE  1 tablet Oral TID  . latanoprost  1 drop Both Eyes QHS  . pantoprazole  40 mg Oral Q0600  . potassium chloride  20 mEq Oral BID  . sodium chloride  3 mL Intravenous Q12H   sodium chloride, acetaminophen, hydrALAZINE, labetalol, ondansetron (ZOFRAN) IV, sodium chloride  Assessment/ Plan:  1. AKI/CKD- non-oliguric. pt with malignant HTN, acute decompensated CHF in setting of ARB. Cardiorenal syndrome. Has h/o CKD, longstanding proteinuria, and Scr of 2 at baseline.  Serologies and SPEP/UPEP pending.  2. Acute respiratory distress- resolved Pulmonary edema/acute decompensated CHF- on PO Furosemide and Bidil continue Bidil 3. Malignant HTN/urgent HTN- Improved BP 4. Proteinuria- non-nephrotic by Uprot/creat ratio, awaiting 24 hour collection although may be lower due to AKI.  5. Anemia- normocytic, iron deficient. Check stool cards and give iron.  Patient stable from renal standpoint. Will need outpatient follow up with Dr Arrie Aran  LOS: 4 [name] W @TODAY @11 :45 AM

## 2013-10-28 LAB — TYPE AND SCREEN: Antibody Screen: NEGATIVE

## 2013-10-28 NOTE — Discharge Summary (Signed)
NAMEAQUINNAH, DEVIN                 ACCOUNT NO.:  [id number]  MEDICAL RECORD NO.:  [id number]  LOCATION:  2W17C                        FACILITY:  [facility]  PHYSICIAN:  Eduardo Osier. Sharyn Lull, M.D. DATE OF BIRTH:  07-Jan-1945  DATE OF ADMISSION:  10/23/2013 DATE OF DISCHARGE:  10/27/2013                              DISCHARGE SUMMARY   ADMITTING DIAGNOSES: 1. Acute pulmonary edema, rule out ischemia, rule out myocardial     infarction. 2. Acute respiratory failure secondary to above. 3. Uncontrolled hypertension. 4. Non-insulin-dependent diabetes mellitus. 5. Acute on chronic kidney disease, stage IV. 6. Hypercholesteremia. 7. Diabetic neuropathy. 8. Morbid obesity. 9. Positive family history of coronary artery disease.  FINAL DIAGNOSES: 1. Status post acute pulmonary edema secondary to depressed left     ventricular systolic function. 2. Status post acute respiratory failure. 3. Hypertension. 4. Insulin-requiring diabetes mellitus. 5. Chronic kidney disease stage IV, stable. 6. Acute on chronic anemia, stable. 7. Hypercholesteremia. 8. Diabetic nephropathy. 9. Diabetic neuropathy. 10.Morbid obesity. 11.Positive family history of coronary artery disease. 12.Resolving urinary tract infection.  DISCHARGE HOME MEDICATIONS:  Carvedilol 25 mg 1 tablet twice daily, ciprofloxacin 250 mg 1 tablet twice daily for 5 days, ferrous sulfate 325 mg 1 tablet 3 times daily, Lantus insulin 10 units at bedtime, BiDil 1 tablet 3 times daily, Lasix 80 mg 1 tablet twice daily, aspirin 81 mg 1 tablet daily, atorvastatin 40 mg daily, Klor-Con 20 mEq daily, Travatan ophthalmic solution 1 drop in both eyes as before.  Patient has been advised to stopped clonidine, glipizide, metoprolol, and Onglyza.  DIET:  Low-salt, low-cholesterol 1800 calories ADA diet.  The patient has been advised to monitor blood sugar and blood pressure daily in chart.  The patient has been advised to restrict fluid to 1-1/2 L  daily and monitor her rate daily.  The patient will be followed up in my office in 1 week and Renal Service in 2 weeks.  CONDITION AT DISCHARGE:  Stable.  We will check her renal function as outpatient in 1 week.  BRIEF HISTORY AND HOSPITAL COURSE:  Ms. Billiter is a 68 year old female with past medical history significant for hypertension, non-insulin- dependent diabetes mellitus, hypercholesteremia, diabetic neuropathy, chronic kidney disease, stage IV, creatinine runs around 2, degenerative joint disease, came to the ER by EMS complaining of progressive increasing shortness of breath for last 2 days.  The patient was seen in my office yesterday for same associated with leg swelling, and her Lasix dose was increased without much improvement.  The patient denies any chest pain, nausea, vomiting, diaphoresis.  The patient does give history of PND, orthopnea, and leg swelling.  Denies any palpitation, lightheadedness, or syncopal episodes.  The patient was noted to be hypoxic with the O2 sats in 70s and acidotic and was placed on BiPAP. The patient received total of IV 80 mg Lasix and was placed on nitro drip with improvement in her symptoms.  PAST MEDICAL HISTORY:  As above.  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  PHYSICAL EXAMINATION:  GENERAL:  She was alert, awake, oriented x3. VITAL SIGNS:  Blood pressure was 184/81, pulse was 92, respiration rate was 35,  O2 sats were 96% on BiPAP. EYES:  Conjunctiva is pink. NECK:  Supple.  Positive JVD. LUNGS:  She has bilateral rales and decreased breath sounds at bases. CARDIOVASCULAR:  S1, S2 was soft.  There was soft systolic murmur and S3 gallop.  There was no pericardial rub. ABDOMEN:  Soft.  Bowel sounds were present, mildly distended, nontender. EXTREMITIES:  There was no clubbing, cyanosis.  There was 4+ edema. NEUROLOGIC:  Grossly intact.  LABS:  Admission labs sodium was 139, potassium 4.2, BUN 55,  creatinine 3.0, glucose was 335.  Troponin I 3 sets were negative.  BNP was 16109. Hemoglobin was 9.7, hematocrit 28.5, white count of 8.6.  Urine culture grew E. coli.  Her iron was low 14, iron-binding capacity was 204, total iron binding capacity was 218, ferritin level 126, saturation ratio was 6.  Repeat hemoglobin on October 26, 2013, was 7.7, hematocrit 22.5. Repeat hemoglobin was 7.5 hematocrit 22, the patient did receive 1 unit of packed RBCs.  Repeat hemoglobin post transfusion was 9, hematocrit 26.2.  Today's sodium is 135, potassium 4.0, BUN was 48, creatinine 3.44.  Blood sugar is 148.  BRIEF HOSPITAL COURSE:  The patient was admitted to step-down unit.  MI was ruled out by serial enzymes and EKG.  EKG showed left bundle-branch block.  Renal Service consultation was obtained.  The patient was started on IV Lasix with good diuresis and resolution of her leg swelling.  The patient did not have any chest pain during the hospital stay.  Discussed with patient at length regarding the access for dialysis and Vascular Surgical consult, patient refused for hemodialysis, creatinine has remained around 3-3.5.  The patient is ambulating in room without any problems.  The patient will be discharged home on above medications and will be followed up in my office in 1 week and by Renal Service in 2 weeks.  We will discuss with patient further regarding need for vascular access as outpatient.     Eduardo Osier. Sharyn Lull, M.D.     M[facility]/MEDQ  D:  10/27/2013  T:  10/28/2013  Job:  [id number]

## 2013-11-20 ENCOUNTER — Encounter (HOSPITAL_COMMUNITY): Payer: Self-pay | Admitting: Emergency Medicine

## 2013-11-20 ENCOUNTER — Inpatient Hospital Stay (HOSPITAL_COMMUNITY)
Admission: EM | Admit: 2013-11-20 | Discharge: 2013-11-25 | DRG: 377 | Disposition: A | Discharge status: Home / Self Care | Payer: Medicare Other | Attending: Cardiology | Admitting: Cardiology

## 2013-11-20 ENCOUNTER — Emergency Department (HOSPITAL_COMMUNITY): Payer: Medicare Other

## 2013-11-20 DIAGNOSIS — E1149 Type 2 diabetes mellitus with other diabetic neurological complication: Secondary | ICD-10-CM | POA: Diagnosis present

## 2013-11-20 DIAGNOSIS — K921 Melena: Principal | ICD-10-CM | POA: Diagnosis present

## 2013-11-20 DIAGNOSIS — I509 Heart failure, unspecified: Secondary | ICD-10-CM | POA: Diagnosis present

## 2013-11-20 DIAGNOSIS — D638 Anemia in other chronic diseases classified elsewhere: Secondary | ICD-10-CM | POA: Diagnosis present

## 2013-11-20 DIAGNOSIS — E1129 Type 2 diabetes mellitus with other diabetic kidney complication: Secondary | ICD-10-CM | POA: Diagnosis present

## 2013-11-20 DIAGNOSIS — I5032 Chronic diastolic (congestive) heart failure: Secondary | ICD-10-CM

## 2013-11-20 DIAGNOSIS — Z96619 Presence of unspecified artificial shoulder joint: Secondary | ICD-10-CM

## 2013-11-20 DIAGNOSIS — E876 Hypokalemia: Secondary | ICD-10-CM | POA: Diagnosis present

## 2013-11-20 DIAGNOSIS — M1990 Unspecified osteoarthritis, unspecified site: Secondary | ICD-10-CM | POA: Diagnosis present

## 2013-11-20 DIAGNOSIS — I5023 Acute on chronic systolic (congestive) heart failure: Secondary | ICD-10-CM | POA: Diagnosis present

## 2013-11-20 DIAGNOSIS — G4730 Sleep apnea, unspecified: Secondary | ICD-10-CM | POA: Diagnosis present

## 2013-11-20 DIAGNOSIS — I428 Other cardiomyopathies: Secondary | ICD-10-CM | POA: Diagnosis present

## 2013-11-20 DIAGNOSIS — E7800 Pure hypercholesterolemia, unspecified: Secondary | ICD-10-CM | POA: Diagnosis present

## 2013-11-20 DIAGNOSIS — E1142 Type 2 diabetes mellitus with diabetic polyneuropathy: Secondary | ICD-10-CM | POA: Diagnosis present

## 2013-11-20 DIAGNOSIS — N058 Unspecified nephritic syndrome with other morphologic changes: Secondary | ICD-10-CM | POA: Diagnosis present

## 2013-11-20 DIAGNOSIS — Z87891 Personal history of nicotine dependence: Secondary | ICD-10-CM

## 2013-11-20 DIAGNOSIS — N289 Disorder of kidney and ureter, unspecified: Secondary | ICD-10-CM

## 2013-11-20 DIAGNOSIS — I129 Hypertensive chronic kidney disease with stage 1 through stage 4 chronic kidney disease, or unspecified chronic kidney disease: Secondary | ICD-10-CM | POA: Diagnosis present

## 2013-11-20 DIAGNOSIS — E1169 Type 2 diabetes mellitus with other specified complication: Secondary | ICD-10-CM | POA: Diagnosis not present

## 2013-11-20 DIAGNOSIS — D649 Anemia, unspecified: Secondary | ICD-10-CM

## 2013-11-20 DIAGNOSIS — N390 Urinary tract infection, site not specified: Secondary | ICD-10-CM

## 2013-11-20 DIAGNOSIS — K5900 Constipation, unspecified: Secondary | ICD-10-CM | POA: Diagnosis present

## 2013-11-20 DIAGNOSIS — Z833 Family history of diabetes mellitus: Secondary | ICD-10-CM

## 2013-11-20 DIAGNOSIS — N184 Chronic kidney disease, stage 4 (severe): Secondary | ICD-10-CM | POA: Diagnosis present

## 2013-11-20 DIAGNOSIS — K922 Gastrointestinal hemorrhage, unspecified: Secondary | ICD-10-CM

## 2013-11-20 DIAGNOSIS — R0600 Dyspnea, unspecified: Secondary | ICD-10-CM

## 2013-11-20 HISTORY — DX: Type 2 diabetes mellitus with diabetic neuropathy, unspecified: E11.40

## 2013-11-20 HISTORY — DX: Heart failure, unspecified: I50.9

## 2013-11-20 HISTORY — DX: Anemia, unspecified: D64.9

## 2013-11-20 HISTORY — DX: Pure hypercholesterolemia, unspecified: E78.00

## 2013-11-20 HISTORY — DX: Unspecified osteoarthritis, unspecified site: M19.90

## 2013-11-20 HISTORY — DX: Chronic kidney disease, unspecified: N18.9

## 2013-11-20 LAB — URINALYSIS W MICROSCOPIC + REFLEX CULTURE
Bilirubin Urine: NEGATIVE
Nitrite: NEGATIVE
Specific Gravity, Urine: 1.013 (ref 1.005–1.030)
Urobilinogen, UA: 0.2 mg/dL (ref 0.0–1.0)
pH: 5.5 (ref 5.0–8.0)

## 2013-11-20 LAB — CBC WITH DIFFERENTIAL/PLATELET
Basophils Absolute: 0 10*3/uL (ref 0.0–0.1)
Basophils Relative: 1 % (ref 0–1)
Eosinophils Relative: 6 % — ABNORMAL HIGH (ref 0–5)
HCT: 26.3 % — ABNORMAL LOW (ref 36.0–46.0)
Hemoglobin: 8.8 g/dL — ABNORMAL LOW (ref 12.0–15.0)
Lymphocytes Relative: 18 % (ref 12–46)
Lymphs Abs: 1.5 10*3/uL (ref 0.7–4.0)
MCH: 28.8 pg (ref 26.0–34.0)
MCV: 85.9 fL (ref 78.0–100.0)
Monocytes Absolute: 0.7 10*3/uL (ref 0.1–1.0)
Neutro Abs: 5.8 10*3/uL (ref 1.7–7.7)
RBC: 3.06 MIL/uL — ABNORMAL LOW (ref 3.87–5.11)
RDW: 15.5 % (ref 11.5–15.5)
WBC: 8.5 10*3/uL (ref 4.0–10.5)

## 2013-11-20 LAB — CBC
HCT: 25.7 % — ABNORMAL LOW (ref 36.0–46.0)
Hemoglobin: 8.8 g/dL — ABNORMAL LOW (ref 12.0–15.0)
MCV: 85.1 fL (ref 78.0–100.0)
Platelets: 256 10*3/uL (ref 150–400)
RDW: 15.5 % (ref 11.5–15.5)
WBC: 7.4 10*3/uL (ref 4.0–10.5)

## 2013-11-20 LAB — COMPREHENSIVE METABOLIC PANEL
ALT: 41 U/L — ABNORMAL HIGH (ref 0–35)
AST: 26 U/L (ref 0–37)
Alkaline Phosphatase: 107 U/L (ref 39–117)
CO2: 26 mEq/L (ref 19–32)
Chloride: 103 mEq/L (ref 96–112)
Creatinine, Ser: 3.34 mg/dL — ABNORMAL HIGH (ref 0.50–1.10)
GFR calc Af Amer: 15 mL/min — ABNORMAL LOW (ref >90)
GFR calc non Af Amer: 13 mL/min — ABNORMAL LOW (ref >90)
Glucose, Bld: 84 mg/dL (ref 70–99)
Potassium: 3.8 mEq/L (ref 3.5–5.1)
Sodium: 142 mEq/L (ref 135–145)
Total Bilirubin: 0.4 mg/dL (ref 0.3–1.2)

## 2013-11-20 LAB — BASIC METABOLIC PANEL
BUN: 61 mg/dL — ABNORMAL HIGH (ref 6–23)
CO2: 23 mEq/L (ref 19–32)
Calcium: 9.6 mg/dL (ref 8.4–10.5)
Chloride: 100 mEq/L (ref 96–112)
Glucose, Bld: 260 mg/dL — ABNORMAL HIGH (ref 70–99)
Potassium: 4 mEq/L (ref 3.5–5.1)
Sodium: 139 mEq/L (ref 135–145)

## 2013-11-20 LAB — TROPONIN I: Troponin I: <0.3 ng/mL (ref <0.30)

## 2013-11-20 LAB — PRO B NATRIURETIC PEPTIDE: Pro B Natriuretic peptide (BNP): 6799 pg/mL — ABNORMAL HIGH (ref 0–125)

## 2013-11-20 MED ORDER — GLIPIZIDE ER 5 MG PO TB24
5.0000 mg | ORAL_TABLET | Freq: Two times a day (BID) | ORAL | Status: DC
  Administered 2013-11-20 – 2013-11-22 (×5): 5 mg via ORAL
  Filled 2013-11-20 (×7): qty 1

## 2013-11-20 MED ORDER — CARVEDILOL 25 MG PO TABS
25.0000 mg | ORAL_TABLET | Freq: Two times a day (BID) | ORAL | Status: DC
  Administered 2013-11-21 – 2013-11-25 (×9): 25 mg via ORAL
  Filled 2013-11-20 (×11): qty 1

## 2013-11-20 MED ORDER — ISOSORB DINITRATE-HYDRALAZINE 20-37.5 MG PO TABS
1.0000 | ORAL_TABLET | Freq: Three times a day (TID) | ORAL | Status: DC
  Administered 2013-11-20 – 2013-11-25 (×14): 1 via ORAL
  Filled 2013-11-20 (×16): qty 1

## 2013-11-20 MED ORDER — PANTOPRAZOLE SODIUM 40 MG PO TBEC
40.0000 mg | DELAYED_RELEASE_TABLET | Freq: Two times a day (BID) | ORAL | Status: DC
  Administered 2013-11-20 – 2013-11-25 (×10): 40 mg via ORAL
  Filled 2013-11-20 (×10): qty 1

## 2013-11-20 MED ORDER — INSULIN ASPART 100 UNIT/ML ~~LOC~~ SOLN
0.0000 [IU] | Freq: Three times a day (TID) | SUBCUTANEOUS | Status: DC
  Administered 2013-11-21: 2 [IU] via SUBCUTANEOUS
  Administered 2013-11-21: 17:00:00 1 [IU] via SUBCUTANEOUS
  Administered 2013-11-22 (×2): 2 [IU] via SUBCUTANEOUS
  Administered 2013-11-24 – 2013-11-25 (×3): 1 [IU] via SUBCUTANEOUS
  Administered 2013-11-25: 12:00:00 2 [IU] via SUBCUTANEOUS

## 2013-11-20 MED ORDER — POLYETHYLENE GLYCOL 3350 17 G PO PACK
17.0000 g | PACK | Freq: Every day | ORAL | Status: DC
  Administered 2013-11-21 – 2013-11-25 (×6): 17 g via ORAL
  Filled 2013-11-20 (×6): qty 1

## 2013-11-20 MED ORDER — ATORVASTATIN CALCIUM 40 MG PO TABS
40.0000 mg | ORAL_TABLET | Freq: Every day | ORAL | Status: DC
  Administered 2013-11-21 – 2013-11-25 (×5): 40 mg via ORAL
  Filled 2013-11-20 (×5): qty 1

## 2013-11-20 MED ORDER — FUROSEMIDE 80 MG PO TABS
80.0000 mg | ORAL_TABLET | Freq: Two times a day (BID) | ORAL | Status: DC
  Administered 2013-11-21 – 2013-11-25 (×9): 80 mg via ORAL
  Filled 2013-11-20 (×11): qty 1

## 2013-11-20 MED ORDER — INSULIN ASPART 100 UNIT/ML ~~LOC~~ SOLN: 0.0000 [IU] | Freq: Every day | SUBCUTANEOUS | Status: DC

## 2013-11-20 MED ORDER — SODIUM CHLORIDE 0.9 % IV SOLN
INTRAVENOUS | Status: DC
  Administered 2013-11-20: 10 mL/h via INTRAVENOUS

## 2013-11-20 MED ORDER — FERROUS SULFATE 325 (65 FE) MG PO TABS: 325.0000 mg | ORAL_TABLET | Freq: Three times a day (TID) | ORAL | Status: DC

## 2013-11-20 MED ORDER — LEVOFLOXACIN IN D5W 250 MG/50ML IV SOLN
250.0000 mg | INTRAVENOUS | Status: DC
  Administered 2013-11-20: 250 mg via INTRAVENOUS
  Filled 2013-11-20: qty 50

## 2013-11-20 NOTE — ED Notes (Signed)
PT was given 1 pint of blood and "had fluid removed" on the 10/28.  States since she was discharged she noticed her stool has been black.  She also c/o sob and continued LE edema.  Denies chest pain or abdominal pain.

## 2013-11-20 NOTE — Consult Note (Signed)
[facility] Consult: 4:01 PM 11/20/2013  LOS: 0 days    Referring Provider: Dr Sharyn Lull  Primary Care Physician:  Robynn Pane, MD Primary Gastroenterologist:  Dr. Christella Hartigan     Reason for Consultation:  "Melenic" stool   HPI: [name] is a 68 y.o. female.  Hx NIDDM, stage 4 CKD, morbid obesity. 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  No EGD on records.   10/24 - 10/28/13 admission for respiratory failure, pulmonary edema.   hgb was 7.7. Measured 9.0 at discharge.  I am told she underwent transfusion but no mention of this in discharge summary. She says she received one unit of PRBC.  Her Iron level, sats and TIBC were low but Ferritin was 126.  The MCV was in mid 80s.  At discharge began some new meds including TID FeSO4 325 mg, carvedilol, lasix.    Since that time she has been constipated and having dark, smaller, difficult to evacuate stools.  Self administering small volume tap water enemas.  No nausea, no anorexia, no pyrosis, no abdominal pain, no weight loss. Last BM was 3 days ago. No nose bleeds or inappropriate bleeding.  Never took iron in past and never transfused before 09/2013.  Not using NSAIDs except 81 mg daily.   Dr Sharyn Lull admitting today with "tarry stools".  Hgb is 8.8. She presented with SOB, not as bad as before, but getting worse.  Her LE edema is stable and improved c/w prior admission She is FOB +.  On my rectal the stool is dark but definitely greentinged and not mahogany colored.   Pt not interested in dialysis nor in colonoscopy or EGD    Past Medical History  Diagnosis Date  . Diabetes mellitus   . Hypertension   . Arthritis   . Sleep apnea   . CKD (chronic kidney disease)   . CHF (congestive heart failure)   . Hypercholesteremia   . Diabetic  neuropathy   . DJD (degenerative joint disease)   . Anemia     Past Surgical History  Procedure Laterality Date  . Total shoulder replacement      right    Prior to Admission medications   Medication Sig Start Date End Date Taking? Authorizing Provider  aspirin EC 81 MG tablet Take 81 mg by mouth daily.   Yes Historical Provider, MD  atorvastatin (LIPITOR) 80 MG tablet Take 40 mg by mouth daily.   Yes Historical Provider, MD  carvedilol (COREG) 25 MG tablet Take 1 tablet (25 mg total) by mouth 2 (two) times daily with a meal. 10/27/13  Yes Robynn Pane, MD  ferrous sulfate 325 (65 FE) MG tablet Take 1 tablet (325 mg total) by mouth 3 (three) times daily with meals. 10/27/13  Yes Robynn Pane, MD  furosemide (LASIX) 80 MG tablet Take 1 tablet (80 mg total) by mouth 2 (two) times daily. 10/27/13  Yes Robynn Pane, MD  glipiZIDE (GLUCOTROL XL) 10 MG 24 hr tablet Take 10 mg by mouth 2 (two) times daily.   Yes  Historical Provider, MD  isosorbide-hydrALAZINE (BIDIL) 20-37.5 MG per tablet Take 1 tablet by mouth 3 (three) times daily. 10/27/13  Yes Robynn Pane, MD  Travoprost, BAK Free, (TRAVATAN) 0.004 % SOLN ophthalmic solution Place 1 drop into both eyes at bedtime.   Yes Historical Provider, MD  potassium chloride SA (K-DUR,KLOR-CON) 20 MEQ tablet Take 20 mEq by mouth daily.    Historical Provider, MD    Scheduled Meds:   Infusions: . levofloxacin (LEVAQUIN) IV 250 mg (11/20/13 1524)   PRN Meds:    Allergies as of 11/20/2013 - Review Complete 11/20/2013  Allergen Reaction Noted  . Penicillins Anaphylaxis   . Lisinopril Cough     Family History  Problem Relation Age of Onset  . Diabetes Brother     History   Social History  . Marital Status: Widowed    Spouse Name: N/A    Number of Children: 5  . Years of Education: N/A   Occupational History  . retried    Social History Main Topics  . Smoking status: Former Games developer  . Smokeless tobacco: Never Used  .  Alcohol Use: No  . Drug Use: No  . Sexual Activity: Not on file   Other Topics Concern  . Not on file   Social History Narrative  . No narrative on file    REVIEW OF SYSTEMS: 12 system review per HPI No oliguria    PHYSICAL EXAM: Vital signs in last 24 hours: Filed Vitals:   11/20/13 1515  BP:   Pulse: 77  Temp:   Resp: 24   Wt Readings from Last 3 Encounters:  11/20/13 85.594 kg (188 lb 11.2 oz)  10/27/13 83.598 kg (184 lb 4.8 oz)  04/30/11 89.359 kg (197 lb)    General: very pleasant, obese, elderly AAF.  comfortable Head:  No swelling or asymmetry  Eyes:  No icterus.  +conj pallor Ears:  Not HOH  Nose:  No discharge or congestion Mouth:  Clear and moist oral mm Neck:  No mass, no JVD, no bruits Lungs:  Clear bil Heart: RRR.  No MRG Abdomen:  Soft, obese, not tender or distended.  No masses or HSM.  No bruits.   Rectal: hard stool in vault, not impacted.  It is dark and greenish, NOT MELENIC.   Musc/Skeltl: no contractures Extremities:  2 to 3 plus pedal, pretibial edema.  Not tender  Neurologic:  Oriented x 3.  No tremor, no limb weakness.  Skin:  No sores or rash Tattoos:  none Nodes:  No cervical adenopathy   Psych:  Pleasant, engaged, fully in control of her environment. Sharp  Intake/Output from previous day:   Intake/Output this shift:    LAB RESULTS:  Recent Labs  11/20/13 1201  WBC 8.5  HGB 8.8*  HCT 26.3*  PLT 276  MCV    85  BMET Lab Results  Component Value Date   NA 139 11/20/2013   NA 135 10/27/2013   NA 131* 10/26/2013   K 4.0 11/20/2013   K 4.0 10/27/2013   K 4.5 10/26/2013   CL 100 11/20/2013   CL 97 10/27/2013   CL 95* 10/26/2013   CO2 23 11/20/2013   CO2 22 10/27/2013   CO2 22 10/26/2013   GLUCOSE 260* 11/20/2013   GLUCOSE 148* 10/27/2013   GLUCOSE 169* 10/26/2013   BUN 61* 11/20/2013   BUN 48* 10/27/2013   BUN 46* 10/26/2013   CREATININE 3.78* 11/20/2013   CREATININE 3.44* 10/27/2013   CREATININE  3.46*  10/26/2013   CALCIUM 9.6 11/20/2013   CALCIUM 8.8 10/27/2013   CALCIUM 8.6 10/26/2013   LFT No results found for this basename: PROT, ALBUMIN, AST, ALT, ALKPHOS, BILITOT, BILIDIR, IBILI,  in the last 72 hours PT/INR Lab Results  Component Value Date   INR 1.00 10/23/2013     Ref. Range 11/20/2013 12:54  Color, Urine Latest Range: YELLOW  YELLOW  APPearance Latest Range: CLEAR  CLOUDY (A)  Specific Gravity, Urine Latest Range: 1.005-1.030  1.013  pH Latest Range: 5.0-8.0  5.5  Glucose Latest Range: NEGATIVE mg/dL 161 (A)  Bilirubin Urine Latest Range: NEGATIVE  NEGATIVE  Ketones, ur Latest Range: NEGATIVE mg/dL NEGATIVE  Protein Latest Range: NEGATIVE mg/dL >096 (A)  Urobilinogen, UA Latest Range: 0.0-1.0 mg/dL 0.2  Nitrite Latest Range: NEGATIVE  NEGATIVE  Leukocytes, UA Latest Range: NEGATIVE  SMALL (A)  Hgb urine dipstick Latest Range: NEGATIVE  SMALL (A)  WBC, UA Latest Range: <3 WBC/hpf 21-50  RBC / HPF Latest Range: <3 RBC/hpf 0-2  Squamous Epithelial / LPF Latest Range: RARE  FEW (A)  Bacteria, UA Latest Range: RARE  FEW (A)   RADIOLOGY STUDIES: Dg Chest 2 View  11/20/2013   CLINICAL DATA:  Shortness of breath, weakness, melena, history hypertension, diabetes, smoking  EXAM: CHEST  2 VIEW  COMPARISON:  10/23/2013  FINDINGS: Enlargement of cardiac silhouette with pulmonary vascular congestion.  Improved pulmonary infiltrates since previous exam with minimal residual interstitial infiltrate.  No pleural effusion or pneumothorax.  Diffuse osseous demineralization with note of a right shoulder prosthesis.  Atherosclerotic calcification aorta.  IMPRESSION: Improved pulmonary infiltrates since previous exam.   Electronically Signed   By: Ulyses Southward M.D.   On: 11/20/2013 13:10    ENDOSCOPIC STUDIES: 06/2011  Screening colonoscopy  Dr Christella Hartigan 2 small polyps in desc colon.  Int and external hemorrhoids.  Pathology - TUBULAR ADENOMA (FOUR FRAGMENTS). NO HIGH GRADE DYSPLASIA OR  MALIGNANCY IDENTIFIED.   IMPRESSION:   *  Chronic, normocytic anemia.  Biggest culprit is ESRD, she may need epogen/procrit.  Iron levels low but this is more anemia of chronic disease \ *  Heme + stool, this is due to the po Iron  *  Constipation, po Iron induced.   *   Respiratory failure, pulmonary edema on admission in late 09/2013.  Current CXR shows continued improvement   PLAN:     *  Per Dr Christella Hartigan *  Will add Miralax to see if it helps constipation. *  Consider parenteral iron infusion.  Consider heme eval.  She may need erythropoetin and iron infusions on a regular basis and hematologist well equipped to oversee this. *  I stopped the Levaquin, got one dose.  Pt denies any irritative urinary sxs.  WBCs normal, no fecver.  There are few bacteria and some WBCs in urine.  Urinary culture is pending. unnecessary abs add to risk of C Diff.  *   As she has taken sensibel approach to her medical care, does not want invasive interventions, would suggest this be formalized with a goals of care meeting to define code status.     Jennye Moccasin  11/20/2013, 4:01 PM Pager: [phone]     ________________________________________________________________________  [facility] MD note:  I personally examined the patient, reviewed the data and agree with the assessment and plan described above.  She noticed dark stools that began very shortly after she started taking three times per day iron about 3-4 weeks ago.  This  also caused constipation.  She has chronic anemia and her Hb is essentially unchanged from d/c 3-4 weeks ago.  I generally recommend only once daily iron for exactly this reason, it is very hard to tolerate bid or tid iron for most people.  No plans for invasive testing, should decrease to once daily iron only.   Rob Bunting, MD [facility] Pager [phone]

## 2013-11-20 NOTE — H&P (Signed)
[name] is an 68 y.o. female.   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 Patient denies any abdominal pain. Denies any nausea or vomiting. Denies any chest pain. Complains of occasional shortness of breath with exertion. States overall feels better after increasing her Lasix dose. Denies any fever or chills. Denies any urinary complaints. Patient was noted to have heme-positive stool and hemoglobin of 8.8 which is not markedly lower as compared to hemoglobin one month ago which was  9.0 . Patient didn't had hemoglobin drop last month also requiring one unit of packed RBC had appointment to see GI as outpatient in December but do to black tarry stools and decided to come to the ED.  Past Medical History  Diagnosis Date  . Diabetes mellitus   . Hypertension   . Arthritis   . Sleep apnea   . CKD (chronic kidney disease)   . CHF (congestive heart failure)   . Hypercholesteremia   . Diabetic neuropathy   . DJD (degenerative joint disease)   . Anemia     Past Surgical History  Procedure Laterality Date  . Total shoulder replacement      right    Family History  Problem Relation Age of Onset  . Diabetes Brother    Social History:  reports that she has quit smoking. She has never used smokeless tobacco. She reports that she does not drink alcohol or use illicit drugs.  Allergies:  Allergies  Allergen Reactions  . Penicillins Anaphylaxis  . Lisinopril Cough    REACTION: Cough     (Not in a hospital  admission)  Results for orders placed during the hospital encounter of 11/20/13 (from the past 48 hour(s))  BASIC METABOLIC PANEL     Status: Abnormal   Collection Time    11/20/13 12:01 PM      Result Value Range   Sodium 139  135 - 145 mEq/L   Potassium 4.0  3.5 - 5.1 mEq/L   Chloride 100  96 - 112 mEq/L   CO2 23  19 - 32 mEq/L   Glucose, Bld 260 (*) 70 - 99 mg/dL   BUN 61 (*) 6 - 23 mg/dL   Creatinine, Ser 7.82 (*) 0.50 - 1.10 mg/dL   Calcium 9.6  8.4 - 95.6 mg/dL   GFR calc non Af Amer 11 (*) >90 mL/min   GFR calc Af Amer 13 (*) >90 mL/min   Comment: (NOTE)     The eGFR has been calculated using the CKD EPI equation.     This calculation has not been validated in all clinical situations.     eGFR's persistently <90 mL/min signify possible Chronic Kidney     Disease.  PRO B NATRIURETIC PEPTIDE     Status: Abnormal   Collection Time    11/20/13 12:01 PM      Result Value Range   Pro B Natriuretic peptide (BNP) 6799.0 (*) 0 - 125 pg/mL  CBC WITH DIFFERENTIAL     Status: Abnormal   Collection Time    11/20/13 12:01 PM      Result Value Range   WBC  8.5  4.0 - 10.5 K/uL   RBC 3.06 (*) 3.87 - 5.11 MIL/uL   Hemoglobin 8.8 (*) 12.0 - 15.0 g/dL   HCT 96.0 (*) 45.4 - 09.8 %   MCV 85.9  78.0 - 100.0 fL   MCH 28.8  26.0 - 34.0 pg   MCHC 33.5  30.0 - 36.0 g/dL   RDW 11.9  14.7 - 82.9 %   Platelets 276  150 - 400 K/uL   Neutrophils Relative % 68  43 - 77 %   Neutro Abs 5.8  1.7 - 7.7 K/uL   Lymphocytes Relative 18  12 - 46 %   Lymphs Abs 1.5  0.7 - 4.0 K/uL   Monocytes Relative 9  3 - 12 %   Monocytes Absolute 0.7  0.1 - 1.0 K/uL   Eosinophils Relative 6 (*) 0 - 5 %   Eosinophils Absolute 0.5  0.0 - 0.7 K/uL   Basophils Relative 1  0 - 1 %   Basophils Absolute 0.0  0.0 - 0.1 K/uL  TROPONIN I     Status: None   Collection Time    11/20/13 12:01 PM      Result Value Range   Troponin I <0.30  <0.30 ng/mL   Comment:            Due to the release kinetics of cTnI,     a  negative result within the first hours     of the onset of symptoms does not rule out     myocardial infarction with certainty.     If myocardial infarction is still suspected,     repeat the test at appropriate intervals.  OCCULT BLOOD, POC DEVICE     Status: Abnormal   Collection Time    11/20/13 12:43 PM      Result Value Range   Fecal Occult Bld POSITIVE (*) NEGATIVE  URINALYSIS W MICROSCOPIC + REFLEX CULTURE     Status: Abnormal   Collection Time    11/20/13 12:54 PM      Result Value Range   Color, Urine YELLOW  YELLOW   APPearance CLOUDY (*) CLEAR   Specific Gravity, Urine 1.013  1.005 - 1.030   pH 5.5  5.0 - 8.0   Glucose, UA 100 (*) NEGATIVE mg/dL   Hgb urine dipstick SMALL (*) NEGATIVE   Bilirubin Urine NEGATIVE  NEGATIVE   Ketones, ur NEGATIVE  NEGATIVE mg/dL   Protein, ur >562 (*) NEGATIVE mg/dL   Urobilinogen, UA 0.2  0.0 - 1.0 mg/dL   Nitrite NEGATIVE  NEGATIVE   Leukocytes, UA SMALL (*) NEGATIVE   WBC, UA 21-50  <3 WBC/hpf   RBC / HPF 0-2  <3 RBC/hpf   Bacteria, UA FEW (*) RARE   Squamous Epithelial / LPF FEW (*) RARE   Dg Chest 2 View  11/20/2013   CLINICAL DATA:  Shortness of breath, weakness, melena, history hypertension, diabetes, smoking  EXAM: CHEST  2 VIEW  COMPARISON:  10/23/2013  FINDINGS: Enlargement of cardiac silhouette with pulmonary vascular congestion.  Improved pulmonary infiltrates since previous exam with minimal residual interstitial infiltrate.  No pleural effusion or pneumothorax.  Diffuse osseous demineralization with note of a right shoulder prosthesis.  Atherosclerotic calcification aorta.  IMPRESSION: Improved pulmonary infiltrates since previous exam.   Electronically Signed   By: Ulyses Southward M.D.   On: 11/20/2013 13:10    Review of Systems  Constitutional: Negative for fever, chills and weight loss.  Eyes: Negative for  blurred vision and double vision.  Respiratory: Positive for shortness of breath. Negative for cough, hemoptysis and  sputum production.   Cardiovascular: Negative for chest pain, palpitations, orthopnea and leg swelling.  Gastrointestinal: Positive for melena. Negative for nausea, vomiting and abdominal pain.  Genitourinary: Negative for dysuria, urgency and frequency.  Neurological: Negative for dizziness.    Blood pressure 186/84, pulse 77, temperature 98.9 F (37.2 C), temperature source Oral, resp. rate 24, height 5' (1.524 m), weight 85.594 kg (188 lb 11.2 oz), SpO2 97.00%. Physical Exam  Constitutional: She is oriented to person, place, and time.  HENT:  Head: Normocephalic and atraumatic.  Eyes: Conjunctivae are normal. Pupils are equal, round, and reactive to light. Left eye exhibits no discharge. No scleral icterus.  Neck: Normal range of motion. Neck supple. No tracheal deviation present. No thyromegaly present.  Cardiovascular: Normal rate and regular rhythm.   Murmur (Soft systolic murmur and S3 gallop noted) heard. Respiratory:  decreased breath sound at bases  GI: Soft. Bowel sounds are normal. She exhibits no distension. There is no tenderness. There is no rebound.  Musculoskeletal:  No clubbing cyanosis trace edema noted  Neurological: She is alert and oriented to person, place, and time.     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 Algie Coffer on call for me for weekend  [facility] N 11/20/2013, 3:46 PM

## 2013-11-20 NOTE — ED Notes (Signed)
Lab at bedside

## 2013-11-20 NOTE — ED Notes (Signed)
Pt remained above 95% while walking.

## 2013-11-20 NOTE — ED Provider Notes (Signed)
CSN: [id number]     Arrival date & time 11/20/13  1126 History   First MD Initiated Contact with Patient 11/20/13 1221     Chief Complaint  Patient presents with  . Shortness of Breath  . Melena    HPI Pt was seen at 1230.  Per pt, c/o gradual onset and worsening of persistent SOB, peripheral edema and "black stools" for the past several weeks. Pt states her symptoms began after she was discharged home from the hospital for "fluid on my lungs." States she "received a blood transfusion" when she was admitted and noticed her stools "turned black after that." States her SOB worsens when she lays flat and on exertion. Denies N/V/D, no abd pain, no back pain, no CP/palpitations, no cough, no fevers, no rectal bleeding.    Past Medical History  Diagnosis Date  . Diabetes mellitus   . Hypertension   . Arthritis   . Sleep apnea   . CKD (chronic kidney disease)   . CHF (congestive heart failure)   . Hypercholesteremia   . Diabetic neuropathy   . DJD (degenerative joint disease)   . Anemia    Past Surgical History  Procedure Laterality Date  . Total shoulder replacement      right   Family History  Problem Relation Age of Onset  . Diabetes Brother    History  Substance Use Topics  . Smoking status: Former Games developer  . Smokeless tobacco: Never Used  . Alcohol Use: No    Review of Systems ROS: Statement: All systems negative except as marked or noted in the HPI; Constitutional: Negative for fever and chills. +generalized weakness/fatigue.; ; Eyes: Negative for eye pain, redness and discharge. ; ; ENMT: Negative for ear pain, hoarseness, nasal congestion, sinus pressure and sore throat. ; ; Cardiovascular: Negative for chest pain, palpitations, diaphoresis. +SOB and peripheral edema. ; ; Respiratory: Negative for cough, wheezing and stridor. ; ; Gastrointestinal: +"black stools." Negative for nausea, vomiting, diarrhea, abdominal pain, blood in stool, hematemesis, jaundice and rectal  bleeding. . ; ; Genitourinary: Negative for dysuria, flank pain and hematuria. ; ; Musculoskeletal: Negative for back pain and neck pain. Negative for swelling and trauma.; ; Skin: Negative for pruritus, rash, abrasions, blisters, bruising and skin lesion.; ; Neuro: Negative for headache, lightheadedness, and neck stiffness. Negative for altered level of consciousness , altered mental status, extremity weakness, paresthesias, involuntary movement, seizure and syncope.      Allergies  Penicillins and Lisinopril  Home Medications   Current Outpatient Rx  Name  Route  Sig  Dispense  Refill  . aspirin EC 81 MG tablet   Oral   Take 81 mg by mouth daily.         Marland Kitchen atorvastatin (LIPITOR) 80 MG tablet   Oral   Take 40 mg by mouth daily.         . carvedilol (COREG) 25 MG tablet   Oral   Take 1 tablet (25 mg total) by mouth 2 (two) times daily with a meal.   60 tablet   3   . ferrous sulfate 325 (65 FE) MG tablet   Oral   Take 1 tablet (325 mg total) by mouth 3 (three) times daily with meals.   90 tablet   3   . furosemide (LASIX) 80 MG tablet   Oral   Take 1 tablet (80 mg total) by mouth 2 (two) times daily.   60 tablet   3   . glipiZIDE (GLUCOTROL  XL) 10 MG 24 hr tablet   Oral   Take 10 mg by mouth 2 (two) times daily.         . isosorbide-hydrALAZINE (BIDIL) 20-37.5 MG per tablet   Oral   Take 1 tablet by mouth 3 (three) times daily.   90 tablet   3   . Travoprost, BAK Free, (TRAVATAN) 0.004 % SOLN ophthalmic solution   Both Eyes   Place 1 drop into both eyes at bedtime.         . potassium chloride SA (K-DUR,KLOR-CON) 20 MEQ tablet   Oral   Take 20 mEq by mouth daily.          BP 186/84  Pulse 77  Temp(Src) 98.9 F (37.2 C) (Oral)  Resp 24  Ht 5' (1.524 m)  Wt 188 lb 11.2 oz (85.594 kg)  BMI 36.85 kg/m2  SpO2 97% Physical Exam 1235: Physical examination:  Nursing notes reviewed; Vital signs and O2 SAT reviewed;  Constitutional: Well developed,  Well nourished, Well hydrated, In no acute distress; Head:  Normocephalic, atraumatic; Eyes: EOMI, PERRL, No scleral icterus; ENMT: Mouth and pharynx normal, Mucous membranes moist; Neck: Supple, Full range of motion, No lymphadenopathy; Cardiovascular: Regular rate and rhythm, No gallop; Respiratory: Breath sounds coarse & equal bilaterally, No wheezes. Speaking in short phrases. Tachypneic. No access mm use. No retrax.; Chest: Nontender, Movement normal; Abdomen: Soft, Nontender, Nondistended, Normal bowel sounds. Rectal exam performed w/permission of pt and ED RN chaperone present.  Anal tone normal.  Non-tender, soft black stool in rectal vault, heme positive.  No fissures, no external hemorrhoids, no palp masses.; Genitourinary: No CVA tenderness; Extremities: Pulses normal, No tenderness, +1 pedal edema bilat without calf asymmetry.; Neuro: AA&Ox3, Major CN grossly intact. No facial droop. Speech clear. No gross focal motor or sensory deficits in extremities.; Skin: Color normal, Warm, Dry.   ED Course  Procedures  EKG Interpretation    Date/Time:  Friday November 20 2013 11:40:36 EST Ventricular Rate:  83 PR Interval:  202 QRS Duration: 146 QT Interval:  422 QTC Calculation: 495 R Axis:   -69 Text Interpretation:  Normal sinus rhythm Left axis deviation Left bundle branch block No significant change since last tracing dated 10/23/2013 Confirmed by [facility]  MD, Nicholos Johns [phone]) on 11/20/2013 1:11:59 PM            MDM  MDM Reviewed: previous chart, nursing note and vitals Reviewed previous: labs and ECG Interpretation: labs, ECG and x-ray   Results for orders placed during the hospital encounter of 11/20/13  BASIC METABOLIC PANEL      Result Value Range   Sodium 139  135 - 145 mEq/L   Potassium 4.0  3.5 - 5.1 mEq/L   Chloride 100  96 - 112 mEq/L   CO2 23  19 - 32 mEq/L   Glucose, Bld 260 (*) 70 - 99 mg/dL   BUN 61 (*) 6 - 23 mg/dL   Creatinine, Ser 9.60 (*) 0.50 - 1.10  mg/dL   Calcium 9.6  8.4 - 45.4 mg/dL   GFR calc non Af Amer 11 (*) >90 mL/min   GFR calc Af Amer 13 (*) >90 mL/min  PRO B NATRIURETIC PEPTIDE      Result Value Range   Pro B Natriuretic peptide (BNP) 6799.0 (*) 0 - 125 pg/mL  CBC WITH DIFFERENTIAL      Result Value Range   WBC 8.5  4.0 - 10.5 K/uL   RBC 3.06 (*) 3.87 -  5.11 MIL/uL   Hemoglobin 8.8 (*) 12.0 - 15.0 g/dL   HCT 16.1 (*) 09.6 - 04.5 %   MCV 85.9  78.0 - 100.0 fL   MCH 28.8  26.0 - 34.0 pg   MCHC 33.5  30.0 - 36.0 g/dL   RDW 40.9  81.1 - 91.4 %   Platelets 276  150 - 400 K/uL   Neutrophils Relative % 68  43 - 77 %   Neutro Abs 5.8  1.7 - 7.7 K/uL   Lymphocytes Relative 18  12 - 46 %   Lymphs Abs 1.5  0.7 - 4.0 K/uL   Monocytes Relative 9  3 - 12 %   Monocytes Absolute 0.7  0.1 - 1.0 K/uL   Eosinophils Relative 6 (*) 0 - 5 %   Eosinophils Absolute 0.5  0.0 - 0.7 K/uL   Basophils Relative 1  0 - 1 %   Basophils Absolute 0.0  0.0 - 0.1 K/uL  TROPONIN I      Result Value Range   Troponin I <0.30  <0.30 ng/mL  URINALYSIS W MICROSCOPIC + REFLEX CULTURE      Result Value Range   Color, Urine YELLOW  YELLOW   APPearance CLOUDY (*) CLEAR   Specific Gravity, Urine 1.013  1.005 - 1.030   pH 5.5  5.0 - 8.0   Glucose, UA 100 (*) NEGATIVE mg/dL   Hgb urine dipstick SMALL (*) NEGATIVE   Bilirubin Urine NEGATIVE  NEGATIVE   Ketones, ur NEGATIVE  NEGATIVE mg/dL   Protein, ur >782 (*) NEGATIVE mg/dL   Urobilinogen, UA 0.2  0.0 - 1.0 mg/dL   Nitrite NEGATIVE  NEGATIVE   Leukocytes, UA SMALL (*) NEGATIVE   WBC, UA 21-50  <3 WBC/hpf   RBC / HPF 0-2  <3 RBC/hpf   Bacteria, UA FEW (*) RARE   Squamous Epithelial / LPF FEW (*) RARE  OCCULT BLOOD, POC DEVICE      Result Value Range   Fecal Occult Bld POSITIVE (*) NEGATIVE   Dg Chest 2 View 11/20/2013   CLINICAL DATA:  Shortness of breath, weakness, melena, history hypertension, diabetes, smoking  EXAM: CHEST  2 VIEW  COMPARISON:  10/23/2013  FINDINGS: Enlargement of cardiac  silhouette with pulmonary vascular congestion.  Improved pulmonary infiltrates since previous exam with minimal residual interstitial infiltrate.  No pleural effusion or pneumothorax.  Diffuse osseous demineralization with note of a right shoulder prosthesis.  Atherosclerotic calcification aorta.  IMPRESSION: Improved pulmonary infiltrates since previous exam.   Electronically Signed   By: Ulyses Southward M.D.   On: 11/20/2013 13:10     Results for ANNDEE, CONNETT (MRN [id number]) as of 11/20/2013 15:17  Ref. Range 10/25/2013 04:22 10/26/2013 04:20 10/26/2013 15:50 10/27/2013 04:13 11/20/2013 12:01  BUN Latest Range: 6-23 mg/dL 43 (H) 47 (H) 46 (H) 48 (H) 61 (H)  Creatinine Latest Range: 0.50-1.10 mg/dL 5.78 (H) 4.69 (H) 6.29 (H) 3.44 (H) 3.78 (H)   Results for XIARA, KNISLEY (MRN [id number]) as of 11/20/2013 15:17  Ref. Range 10/23/2013 14:45 11/20/2013 12:01  Pro B Natriuretic peptide (BNP) Latest Range: 0-125 pg/mL 33085.0 (H) 6799.0 (H)   Results for MATRICE, HERRO (MRN [id number]) as of 11/20/2013 15:17  Ref. Range 10/23/2013 14:45 10/26/2013 04:20 10/26/2013 15:50 10/27/2013 04:13 11/20/2013 12:01  Hemoglobin Latest Range: 12.0-15.0 g/dL 9.7 (L) 7.7 (L) 7.5 (L) 9.0 (L) 8.8 (L)  HCT Latest Range: 36.0-46.0 % 28.5 (L) 22.5 (L) 22.0 (L) 26.2 (L) 26.3 (L)    1445:  +  UTI, UC pending; will dose IV levaquin. H/H lower than previous with heme positive stools. BUN/Cr elevated from baseline. No acute infiltrate on CXR today. Dx and testing d/w pt.  Questions answered.  Verb understanding, agreeable to admit. T/C to Dr. Sharyn Lull, case discussed, including:  HPI, pertinent PM/SHx, VS/PE, dx testing, ED course and treatment:  Agreeable to admit, requests he will come to ED for eval to admit.   Laray Anger, DO 11/21/13 1124

## 2013-11-20 NOTE — Progress Notes (Signed)
ANTIBIOTIC CONSULT NOTE - INITIAL  Pharmacy Consult for levaquin Indication: UTI  Allergies  Allergen Reactions  . Penicillins Anaphylaxis  . Lisinopril Cough    REACTION: Cough    Patient Measurements: Height: 5' (152.4 cm) Weight: 188 lb 11.2 oz (85.594 kg) IBW/kg (Calculated) : 45.5 Adjusted Body Weight:   Vital Signs: Temp: 98.9 F (37.2 C) (11/21 1144) Temp src: Oral (11/21 1144) BP: 186/84 mmHg (11/21 1330) Pulse Rate: 73 (11/21 1330) Intake/Output from previous day:   Intake/Output from this shift:    Labs:  Recent Labs  11/20/13 1201  WBC 8.5  HGB 8.8*  PLT 276  CREATININE 3.78*   Estimated Creatinine Clearance: 13.6 ml/min (by C-G formula based on Cr of 3.78). No results found for this basename: VANCOTROUGH, Leodis Binet, VANCORANDOM, GENTTROUGH, GENTPEAK, GENTRANDOM, TOBRATROUGH, TOBRAPEAK, TOBRARND, AMIKACINPEAK, AMIKACINTROU, AMIKACIN,  in the last 72 hours   Microbiology: Recent Results (from the past 720 hour(s))  MRSA PCR SCREENING     Status: None   Collection Time    10/23/13 12:48 PM      Result Value Range Status   MRSA by PCR NEGATIVE  NEGATIVE Final   Comment:            The GeneXpert MRSA Assay (FDA     approved for NASAL specimens     only), is one component of a     comprehensive MRSA colonization     surveillance program. It is not     intended to diagnose MRSA     infection nor to guide or     monitor treatment for     MRSA infections.  URINE CULTURE     Status: None   Collection Time    10/23/13  4:00 PM      Result Value Range Status   Specimen Description URINE, CLEAN CATCH   Final   Special Requests NONE   Final   Culture  Setup Time     Final   Value: 10/24/2013 01:05     Performed at [facility] Count     Final   Value: >=100,000 COLONIES/ML     Performed at [facility]   Culture     Final   Value: ESCHERICHIA COLI     Performed at [facility]   Report Status 10/26/2013 FINAL    Final   Organism ID, Bacteria ESCHERICHIA COLI   Final    Medical History: Past Medical History  Diagnosis Date  . Diabetes mellitus   . Hypertension   . Arthritis   . Sleep apnea   . CKD (chronic kidney disease)   . CHF (congestive heart failure)   . Hypercholesteremia   . Diabetic neuropathy   . DJD (degenerative joint disease)     Medications:  Anti-infectives   Start     Dose/Rate Route Frequency Ordered Stop   11/20/13 1500  Levofloxacin (LEVAQUIN) IVPB 250 mg     250 mg 50 mL/hr over 60 Minutes Intravenous Every 24 hours 11/20/13 1458       Assessment: 68 yof presented to the ED with SOB and LE edema. To start empiric levaquin for UTI. Pt is afebrile and WBC is WNL. Noted history of CKD but Scr is elevated above baseline.   Goal of Therapy:  Eradication of infection  Plan:  1. Levaquin 250mg  IV Q24H 2. F/u renal fxn, C&S, clinical status   Aubrey Blackard, Drake Leach 11/20/2013,2:58 PM

## 2013-11-20 NOTE — ED Notes (Signed)
Pt returned from radiology.

## 2013-11-20 NOTE — ED Notes (Signed)
Attempted to call report, Secretary reports she is unsure which RN will be getting the patient but will find out and call back.

## 2013-11-20 NOTE — ED Notes (Signed)
Attempted to call report again. Told unable to take report at this time d/t more than 1 admit coming to their floor at this time.

## 2013-11-21 ENCOUNTER — Encounter (HOSPITAL_COMMUNITY): Payer: Self-pay | Admitting: *Deleted

## 2013-11-21 LAB — URINE CULTURE: Colony Count: NO GROWTH

## 2013-11-21 LAB — GLUCOSE, CAPILLARY
Glucose-Capillary: 89 mg/dL (ref 70–99)
Glucose-Capillary: 89 mg/dL (ref 70–99)
Glucose-Capillary: 164 mg/dL — ABNORMAL HIGH (ref 70–99)

## 2013-11-21 LAB — CBC
Hemoglobin: 9 g/dL — ABNORMAL LOW (ref 12.0–15.0)
MCH: 27.9 pg (ref 26.0–34.0)
MCV: 84.8 fL (ref 78.0–100.0)
Platelets: 281 10*3/uL (ref 150–400)
RBC: 3.23 MIL/uL — ABNORMAL LOW (ref 3.87–5.11)
WBC: 7.9 10*3/uL (ref 4.0–10.5)

## 2013-11-21 LAB — BASIC METABOLIC PANEL
CO2: 23 mEq/L (ref 19–32)
Chloride: 102 mEq/L (ref 96–112)
Creatinine, Ser: 3.41 mg/dL — ABNORMAL HIGH (ref 0.50–1.10)
GFR calc Af Amer: 15 mL/min — ABNORMAL LOW (ref >90)
Potassium: 3.1 mEq/L — ABNORMAL LOW (ref 3.5–5.1)

## 2013-11-21 LAB — HEMOGLOBIN A1C
Hgb A1c MFr Bld: 7.3 % — ABNORMAL HIGH (ref <5.7)
Mean Plasma Glucose: 163 mg/dL — ABNORMAL HIGH (ref <117)

## 2013-11-21 LAB — OCCULT BLOOD X 1 CARD TO LAB, STOOL: Fecal Occult Bld: NEGATIVE

## 2013-11-21 MED ORDER — AMLODIPINE BESYLATE 5 MG PO TABS
5.0000 mg | ORAL_TABLET | Freq: Every day | ORAL | Status: DC
  Administered 2013-11-21 – 2013-11-25 (×5): 5 mg via ORAL
  Filled 2013-11-21 (×5): qty 1

## 2013-11-21 MED ORDER — BISACODYL 10 MG RE SUPP
10.0000 mg | Freq: Every day | RECTAL | Status: DC | PRN
  Administered 2013-11-21: 10 mg via RECTAL
  Filled 2013-11-21: qty 1

## 2013-11-21 MED ORDER — HYDROCODONE-ACETAMINOPHEN 5-325 MG PO TABS: 1.0000 | ORAL_TABLET | ORAL | Status: DC | PRN

## 2013-11-21 NOTE — Progress Notes (Signed)
Pharmacist Heart Failure Core Measure Documentation  Assessment: [name] has an EF documented as 30-35% on 10/23/13 by ECHO.  Rationale: Heart failure patients with left ventricular systolic dysfunction (LVSD) and an EF < 40% should be prescribed an angiotensin converting enzyme inhibitor (ACEI) or angiotensin receptor blocker (ARB) at discharge unless a contraindication is documented in the medical record.  This patient is not currently on an ACEI or ARB for HF.  This note is being placed in the record in order to provide documentation that a contraindication to the use of these agents is present for this encounter.  ACE Inhibitor or Angiotensin Receptor Blocker is contraindicated (specify all that apply)  []   ACEI allergy AND ARB allergy []   Angioedema []   Moderate or severe aortic stenosis []   Hyperkalemia []   Hypotension []   Renal artery stenosis [x]   Worsening renal function, preexisting renal disease or dysfunction   [name], Tsz-Yin 11/21/2013 12:29 PM

## 2013-11-21 NOTE — Progress Notes (Signed)
Subjective:  C/O constipation x 5 days. Afebrile.  Objective:  Vital Signs in the last 24 hours: Temp:  [98.1 F (36.7 C)-98.9 F (37.2 C)] 98.3 F (36.8 C) (11/22 0955) Pulse Rate:  [70-87] 70 (11/22 0955) Cardiac Rhythm:  [-]  Resp:  [18-33] 20 (11/22 0955) BP: (147-199)/(60-97) 147/60 mmHg (11/22 0955) SpO2:  [93 %-98 %] 97 % (11/22 0955) Weight:  [83.4 kg (183 lb 13.8 oz)-86.7 kg (191 lb 2.2 oz)] 86.7 kg (191 lb 2.2 oz) (11/22 0955)  Physical Exam: BP Readings from Last 1 Encounters:  11/21/13 147/60     Wt Readings from Last 1 Encounters:  11/21/13 86.7 kg (191 lb 2.2 oz)    Weight change:   HEENT: [place]/AT, Eyes-Brown, PERL, EOMI, Conjunctiva-Pale pink, Sclera-Non-icteric Neck: No JVD, No bruit, Trachea midline. Lungs:  Clear, Bilateral. Cardiac:  Regular rhythm, normal S1 and S2, no S3. II/VI systolic murmur. Abdomen:  Soft, non-tender. Extremities:  No edema present. No cyanosis. No clubbing. CNS: AxOx3, Cranial nerves grossly intact, moves all 4 extremities. Right handed. Skin: Warm and dry.   Intake/Output from previous day: 11/21 0701 - 11/22 0700 In: -  Out: 450 [Urine:450]    Lab Results: BMET    Component Value Date/Time   NA 142 11/21/2013 0555   K 3.1* 11/21/2013 0555   CL 102 11/21/2013 0555   CO2 23 11/21/2013 0555   GLUCOSE 109* 11/21/2013 0555   BUN 56* 11/21/2013 0555   CREATININE 3.41* 11/21/2013 0555   CALCIUM 9.6 11/21/2013 0555   GFRNONAA 13* 11/21/2013 0555   GFRAA 15* 11/21/2013 0555   CBC    Component Value Date/Time   WBC 7.9 11/21/2013 0555   RBC 3.23* 11/21/2013 0555   HGB 9.0* 11/21/2013 0555   HCT 27.4* 11/21/2013 0555   PLT 281 11/21/2013 0555   MCV 84.8 11/21/2013 0555   MCH 27.9 11/21/2013 0555   MCHC 32.8 11/21/2013 0555   RDW 15.5 11/21/2013 0555   LYMPHSABS 1.5 11/20/2013 1201   MONOABS 0.7 11/20/2013 1201   EOSABS 0.5 11/20/2013 1201   BASOSABS 0.0 11/20/2013 1201   CARDIAC ENZYMES Lab Results  Component  Value Date   CKTOTAL 211* 10/23/2013   TROPONINI <0.30 11/20/2013    Scheduled Meds: . amLODipine  5 mg Oral Daily  . atorvastatin  40 mg Oral Daily  . carvedilol  25 mg Oral BID WC  . furosemide  80 mg Oral BID  . glipiZIDE  5 mg Oral BID  . insulin aspart  0-5 Units Subcutaneous QHS  . insulin aspart  0-9 Units Subcutaneous TID WC  . isosorbide-hydrALAZINE  1 tablet Oral TID  . pantoprazole  40 mg Oral BID  . polyethylene glycol  17 g Oral Daily   Continuous Infusions: . sodium chloride 10 mL/hr (11/20/13 2210)   PRN Meds:.bisacodyl, HYDROcodone-acetaminophen  Assessment/Plan: Acute on chronic anemia with heme-positive stools rule out GI loss  Resolving decompensated systolic heart failure  Hypertension  Non-insulin-dependent diabetes mellitus  Chronic kidney disease stage IV  Anemia of chronic disease  Hypercholesteremia and  Diabetic nephropathy  Diabetic neuropathy  Degenerative joint disease   Plan Laxative. Appreciate GI consult.   LOS: 1 day    Orpah Cobb  MD  11/21/2013, 10:27 AM

## 2013-11-21 NOTE — Progress Notes (Addendum)
Pt. C/o headache 8/10.Pt. Also with elevated BP. On call MD for [facility] paged. RN will continue to monitor pt. Blood pressure 188/97, pulse 73, temperature 98.2 F (36.8 C), temperature source Oral, resp. rate 18, height 5' (1.524 m), weight 83.4 kg (183 lb 13.8 oz), SpO2 97.00%.  Dr. Algie Coffer, MD, notified of pt. Request for pain medication and elevated BP. New orders for pain medication received. MD to follow-up with pt. Regarding BP. On coming RN made aware. Jehieli Brassell, Cheryll Dessert

## 2013-11-22 LAB — GLUCOSE, CAPILLARY
Glucose-Capillary: 158 mg/dL — ABNORMAL HIGH (ref 70–99)
Glucose-Capillary: 104 mg/dL — ABNORMAL HIGH (ref 70–99)

## 2013-11-22 LAB — BASIC METABOLIC PANEL
BUN: 60 mg/dL — ABNORMAL HIGH (ref 6–23)
Calcium: 9.1 mg/dL (ref 8.4–10.5)
Creatinine, Ser: 3.41 mg/dL — ABNORMAL HIGH (ref 0.50–1.10)
GFR calc non Af Amer: 13 mL/min — ABNORMAL LOW (ref >90)
Glucose, Bld: 183 mg/dL — ABNORMAL HIGH (ref 70–99)

## 2013-11-22 LAB — CBC
HCT: 25 % — ABNORMAL LOW (ref 36.0–46.0)
Hemoglobin: 8.3 g/dL — ABNORMAL LOW (ref 12.0–15.0)
MCH: 28.9 pg (ref 26.0–34.0)
MCHC: 33.2 g/dL (ref 30.0–36.0)
MCV: 87.1 fL (ref 78.0–100.0)

## 2013-11-22 MED ORDER — POTASSIUM CHLORIDE CRYS ER 20 MEQ PO TBCR
40.0000 meq | EXTENDED_RELEASE_TABLET | Freq: Once | ORAL | Status: AC
  Administered 2013-11-22: 40 meq via ORAL
  Filled 2013-11-22: qty 2

## 2013-11-22 NOTE — Progress Notes (Signed)
Subjective:  Feeling better. No additional GI bleed per patient. Afebrile.  Objective:  Vital Signs in the last 24 hours: Temp:  [97.1 F (36.2 C)-98 F (36.7 C)] 97.4 F (36.3 C) (11/23 0523) Pulse Rate:  [70-80] 80 (11/23 0523) Cardiac Rhythm:  [-] Normal sinus rhythm;Heart block;Bundle branch block (11/22 1940) Resp:  [20] 20 (11/23 0523) BP: (151-163)/(58-76) 152/62 mmHg (11/23 0523) SpO2:  [96 %-98 %] 98 % (11/23 0523) Weight:  [83.8 kg (184 lb 11.9 oz)] 83.8 kg (184 lb 11.9 oz) (11/23 0523)  Physical Exam: BP Readings from Last 1 Encounters:  11/22/13 152/62     Wt Readings from Last 1 Encounters:  11/22/13 83.8 kg (184 lb 11.9 oz)    Weight change: 1.106 kg (2 lb 7 oz)  HEENT: [place]/AT, Eyes-Brown, PERL, EOMI, Conjunctiva-Pale, Sclera-Non-icteric Neck: No JVD, No bruit, Trachea midline. Lungs:  Clear, Bilateral. Cardiac:  Regular rhythm, normal S1 and S2, no S3. II/VI systolic murmur. Abdomen:  Soft, non-tender. Extremities:  Trace ankle edema present. No cyanosis. No clubbing. CNS: AxOx3, Cranial nerves grossly intact, moves all 4 extremities. Right handed. Skin: Warm and dry.   Intake/Output from previous day: 11/22 0701 - 11/23 0700 In: 1148 [P.O.:1148] Out: -     Lab Results: BMET    Component Value Date/Time   NA 140 11/22/2013 0530   K 3.4* 11/22/2013 0530   CL 103 11/22/2013 0530   CO2 25 11/22/2013 0530   GLUCOSE 183* 11/22/2013 0530   BUN 60* 11/22/2013 0530   CREATININE 3.41* 11/22/2013 0530   CALCIUM 9.1 11/22/2013 0530   GFRNONAA 13* 11/22/2013 0530   GFRAA 15* 11/22/2013 0530   CBC    Component Value Date/Time   WBC 6.7 11/22/2013 0530   RBC 2.87* 11/22/2013 0530   HGB 8.3* 11/22/2013 0530   HCT 25.0* 11/22/2013 0530   PLT 247 11/22/2013 0530   MCV 87.1 11/22/2013 0530   MCH 28.9 11/22/2013 0530   MCHC 33.2 11/22/2013 0530   RDW 15.6* 11/22/2013 0530   LYMPHSABS 1.5 11/20/2013 1201   MONOABS 0.7 11/20/2013 1201   EOSABS 0.5  11/20/2013 1201   BASOSABS 0.0 11/20/2013 1201   CARDIAC ENZYMES Lab Results  Component Value Date   CKTOTAL 211* 10/23/2013   TROPONINI <0.30 11/20/2013    Scheduled Meds: . amLODipine  5 mg Oral Daily  . atorvastatin  40 mg Oral Daily  . carvedilol  25 mg Oral BID WC  . furosemide  80 mg Oral BID  . glipiZIDE  5 mg Oral BID  . insulin aspart  0-5 Units Subcutaneous QHS  . insulin aspart  0-9 Units Subcutaneous TID WC  . isosorbide-hydrALAZINE  1 tablet Oral TID  . pantoprazole  40 mg Oral BID  . polyethylene glycol  17 g Oral Daily  . potassium chloride  40 mEq Oral Once   Continuous Infusions: . sodium chloride 10 mL/hr (11/20/13 2210)   PRN Meds:.bisacodyl, HYDROcodone-acetaminophen  Assessment/Plan: Acute on chronic anemia and blood loss  Resolving decompensated systolic heart failure  Hypertension  Non-insulin-dependent diabetes mellitus  Chronic kidney disease stage IV  Anemia of chronic disease  Hypercholesteremia and  Diabetic nephropathy  Diabetic neuropathy  Degenerative joint disease Hypokalemia  Potassium supplement x one. No Ace-inhibitor due to renal dysfunction.    LOS: 2 days    Orpah Cobb  MD  11/22/2013, 10:51 AM

## 2013-11-23 LAB — BASIC METABOLIC PANEL
CO2: 22 mEq/L (ref 19–32)
Chloride: 102 mEq/L (ref 96–112)
Creatinine, Ser: 3.78 mg/dL — ABNORMAL HIGH (ref 0.50–1.10)
GFR calc Af Amer: 13 mL/min — ABNORMAL LOW (ref >90)
GFR calc non Af Amer: 11 mL/min — ABNORMAL LOW (ref >90)

## 2013-11-23 LAB — CBC
HCT: 25.3 % — ABNORMAL LOW (ref 36.0–46.0)
MCH: 28.5 pg (ref 26.0–34.0)
MCHC: 32.4 g/dL (ref 30.0–36.0)
MCV: 87.8 fL (ref 78.0–100.0)
Platelets: 233 10*3/uL (ref 150–400)
RBC: 2.88 MIL/uL — ABNORMAL LOW (ref 3.87–5.11)
WBC: 5.9 10*3/uL (ref 4.0–10.5)

## 2013-11-23 LAB — GLUCOSE, CAPILLARY
Glucose-Capillary: 89 mg/dL (ref 70–99)
Glucose-Capillary: 120 mg/dL — ABNORMAL HIGH (ref 70–99)

## 2013-11-23 NOTE — Progress Notes (Signed)
Patient's cbg was 53 this morning.  MD notified Verbal order given  to hold glipizide for today.

## 2013-11-23 NOTE — Progress Notes (Signed)
Pt CBG low 53, orange juice given recheck after 15 min, CBG 83. We'll continue to monitor.

## 2013-11-23 NOTE — Plan of Care (Signed)
Problem: Phase I Progression Outcomes Goal: EF % per last Echo/documented,Core Reminder form on chart Outcome: Completed/Met Date Met:  11/23/13 EF 30 - 35%

## 2013-11-23 NOTE — Clinical Documentation Improvement (Signed)
THIS DOCUMENT IS NOT A PERMANENT PART OF THE MEDICAL RECORD  Please update your documentation with the medical record to reflect your response to this query. If you need help knowing how to do this please call [phone].  11/23/13  Dr. Sharyn Lull,  In a better effort to capture your patient's severity of illness, reflect appropriate length of stay and utilization of resources, a review of the patient medical record has revealed the following information regarding heart failure:   - "Resolving decompensated systolic heart failure" Per dictated H&P   - Recent admission for Acute on Chronic Systolic Heart Failure 10/23/13 through 10/27/13.    Based on your clinical judgment, please document the "ACUITY" of the patients's systolic heart failure that was monitored and treated this admission:   - Acute   - Acute on Chronic   - Chronic   In responding to this query please exercise your independent judgment.    The fact that a query is asked, does not imply that any particular answer is desired or expected.   Reviewed: additional documentation in the medical record  Thank You,  Jerral Ralph  RN BSN CCDS Certified Clinical Documentation Specialist: [phone] Health Information Management 

## 2013-11-23 NOTE — Progress Notes (Signed)
Subjective:  Patient denies any chest pain or shortness of breath. Had episode of hypoglycemia earlier today. Hemoglobin remained stable although low. Felt does not need any further GI evaluation acutely. Patient states feels weak today and do not feel strong enough to go home today  Objective:  Vital Signs in the last 24 hours: Temp:  [97.7 F (36.5 C)-98.6 F (37 C)] 97.7 F (36.5 C) (11/24 0603) Pulse Rate:  [70-72] 70 (11/24 0603) Resp:  [17-20] 18 (11/24 0603) BP: (135-153)/(63-76) 153/66 mmHg (11/24 0603) SpO2:  [99 %-100 %] 100 % (11/24 0603) Weight:  [84.9 kg (187 lb 2.7 oz)] 84.9 kg (187 lb 2.7 oz) (11/24 0603)  Intake/Output from previous day: 11/23 0701 - 11/24 0700 In: 1120 [P.O.:1120] Out: -  Intake/Output from this shift: Total I/O In: 360 [P.O.:360] Out: 400 [Urine:400]  Physical Exam: Neck: no adenopathy, no carotid bruit, no JVD and supple, symmetrical, trachea midline Lungs: Decreased breath sound at bases air entry improved Heart: regular rate and rhythm, S1, S2 normal and Soft systolic murmur noted Abdomen: soft, non-tender; bowel sounds normal; no masses,  no organomegaly Extremities: extremities normal, atraumatic, no cyanosis or edema  Lab Results:  Recent Labs  11/22/13 0530 11/23/13 0540  WBC 6.7 5.9  HGB 8.3* 8.2*  PLT 247 233    Recent Labs  11/22/13 0530 11/23/13 0540  NA 140 139  K 3.4* 3.7  CL 103 102  CO2 25 22  GLUCOSE 183* 61*  BUN 60* 63*  CREATININE 3.41* 3.78*    Recent Labs  11/20/13 1201  TROPONINI <0.30   Hepatic Function Panel  Recent Labs  11/20/13 2210  PROT 6.9  ALBUMIN 3.1*  AST 26  ALT 41*  ALKPHOS 107  BILITOT 0.4   No results found for this basename: CHOL,  in the last 72 hours No results found for this basename: PROTIME,  in the last 72 hours  Imaging: Imaging results have been reviewed and No results found.  Cardiac Studies:  Assessment/Plan:  Acute on chronic anemia with heme-positive  stools Resolving decompensated systolic heart failure  Status post hypoglycemia Hypertension  Non-insulin-dependent diabetes mellitus  Chronic kidney disease stage IV  Anemia of chronic disease  Hypercholesteremia and  Diabetic nephropathy  Diabetic neuropathy  Degenerative joint disease  Plan Hold glipizide today Check labs in a.m. Possible discharge tomorrow if stable Plan   LOS: 3 days    Krishawna Stiefel N 11/23/2013, 10:31 AM

## 2013-11-24 LAB — CBC
Hemoglobin: 7.5 g/dL — ABNORMAL LOW (ref 12.0–15.0)
MCH: 28.8 pg (ref 26.0–34.0)
MCHC: 32.8 g/dL (ref 30.0–36.0)
MCV: 88.1 fL (ref 78.0–100.0)
Platelets: 224 10*3/uL (ref 150–400)
RDW: 15.7 % — ABNORMAL HIGH (ref 11.5–15.5)
WBC: 6.1 10*3/uL (ref 4.0–10.5)

## 2013-11-24 LAB — BASIC METABOLIC PANEL
Calcium: 8.8 mg/dL (ref 8.4–10.5)
Chloride: 101 mEq/L (ref 96–112)
Creatinine, Ser: 3.83 mg/dL — ABNORMAL HIGH (ref 0.50–1.10)
GFR calc Af Amer: 13 mL/min — ABNORMAL LOW (ref >90)
GFR calc non Af Amer: 11 mL/min — ABNORMAL LOW (ref >90)

## 2013-11-24 LAB — PRO B NATRIURETIC PEPTIDE: Pro B Natriuretic peptide (BNP): 3945 pg/mL — ABNORMAL HIGH (ref 0–125)

## 2013-11-24 LAB — GLUCOSE, CAPILLARY: Glucose-Capillary: 102 mg/dL — ABNORMAL HIGH (ref 70–99)

## 2013-11-24 MED ORDER — FUROSEMIDE 10 MG/ML IJ SOLN
40.0000 mg | Freq: Once | INTRAMUSCULAR | Status: AC
  Administered 2013-11-24: 10:00:00 40 mg via INTRAVENOUS
  Filled 2013-11-24: qty 4

## 2013-11-24 NOTE — Progress Notes (Signed)
Pt a/o no c/o pain, pt receiving 1 unit of blood for Hgb 7.5

## 2013-11-24 NOTE — Progress Notes (Signed)
Subjective:  Denies any chest pain or shortness of breath. No further bleeding per rectum. Also complains of constipation. Noted to have hemoglobin dropped to 7.5, will transfuse one unit of packed RBC along with Lasix IV  Objective:  Vital Signs in the last 24 hours: Temp:  [97 F (36.1 C)-98.2 F (36.8 C)] 97.2 F (36.2 C) (11/25 0501) Pulse Rate:  [70-72] 70 (11/25 0501) Resp:  [17-18] 18 (11/25 0501) BP: (112-171)/(61-80) 145/61 mmHg (11/25 0501) SpO2:  [99 %-100 %] 100 % (11/25 0716) Weight:  [86.3 kg (190 lb 4.1 oz)] 86.3 kg (190 lb 4.1 oz) (11/25 0624)  Intake/Output from previous day: 11/24 0701 - 11/25 0700 In: 960 [P.O.:960] Out: 1250 [Urine:1250] Intake/Output from this shift:    Physical Exam: Neck: no adenopathy, no carotid bruit, no JVD and supple, symmetrical, trachea midline Lungs: Decreased breath sound at bases Heart: regular rate and rhythm, S1, S2 normal and Soft systolic murmur noted Abdomen: soft, non-tender; bowel sounds normal; no masses,  no organomegaly Extremities: extremities normal, atraumatic, no cyanosis or edema  Lab Results:  Recent Labs  11/23/13 0540 11/24/13 0535  WBC 5.9 6.1  HGB 8.2* 7.5*  PLT 233 224    Recent Labs  11/23/13 0540 11/24/13 0535  NA 139 139  K 3.7 4.3  CL 102 101  CO2 22 25  GLUCOSE 61* 120*  BUN 63* 69*  CREATININE 3.78* 3.83*   No results found for this basename: TROPONINI, CK, MB,  in the last 72 hours Hepatic Function Panel No results found for this basename: PROT, ALBUMIN, AST, ALT, ALKPHOS, BILITOT, BILIDIR, IBILI,  in the last 72 hours No results found for this basename: CHOL,  in the last 72 hours No results found for this basename: PROTIME,  in the last 72 hours  Imaging: Imaging results have been reviewed and No results found.  Cardiac Studies:  Assessment/Plan:  Acute on chronic anemia with heme-positive stools  Resolving decompensated systolic heart failure  Status post hypoglycemia   Hypertension  Non-insulin-dependent diabetes mellitus  Chronic kidney disease stage IV  Anemia of chronic disease  Hypercholesteremia and  Diabetic nephropathy  Diabetic neuropathy  Degenerative joint disease  Plan Type and cross match and transfuse one unit of packed RBC Check labs in a.m. Rx for constipation  LOS: 4 days    Kitiara Hintze N 11/24/2013, 8:43 AM

## 2013-11-24 NOTE — Progress Notes (Signed)
1720 Resting comfortably. No apparent distress . Daughter came in to visit . Conversant

## 2013-11-25 LAB — TYPE AND SCREEN
ABO/RH(D): A POS
Antibody Screen: NEGATIVE
Unit division: 0

## 2013-11-25 LAB — BASIC METABOLIC PANEL
BUN: 69 mg/dL — ABNORMAL HIGH (ref 6–23)
CO2: 24 mEq/L (ref 19–32)
Chloride: 102 mEq/L (ref 96–112)
Creatinine, Ser: 3.8 mg/dL — ABNORMAL HIGH (ref 0.50–1.10)
GFR calc Af Amer: 13 mL/min — ABNORMAL LOW (ref >90)
GFR calc non Af Amer: 11 mL/min — ABNORMAL LOW (ref >90)
Potassium: 3.7 mEq/L (ref 3.5–5.1)

## 2013-11-25 LAB — GLUCOSE, CAPILLARY
Glucose-Capillary: 132 mg/dL — ABNORMAL HIGH (ref 70–99)
Glucose-Capillary: 181 mg/dL — ABNORMAL HIGH (ref 70–99)

## 2013-11-25 LAB — CBC
HCT: 28 % — ABNORMAL LOW (ref 36.0–46.0)
Hemoglobin: 9.2 g/dL — ABNORMAL LOW (ref 12.0–15.0)
MCV: 85.6 fL (ref 78.0–100.0)
RBC: 3.27 MIL/uL — ABNORMAL LOW (ref 3.87–5.11)
WBC: 6.8 10*3/uL (ref 4.0–10.5)

## 2013-11-25 MED ORDER — PANTOPRAZOLE SODIUM 40 MG PO TBEC: 40.0000 mg | DELAYED_RELEASE_TABLET | Freq: Two times a day (BID) | ORAL | Status: AC

## 2013-11-25 MED ORDER — GLIPIZIDE ER 5 MG PO TB24: 5.0000 mg | ORAL_TABLET | Freq: Two times a day (BID) | ORAL | Status: AC

## 2013-11-25 NOTE — Progress Notes (Signed)
DC IV, DC Tele, DC Home. Discharge instructions and home medications discussed with patient. Patient denied any questions or concerns at this time. Patient leaving unit via wheelchair and appears in no acute distress.  

## 2013-11-25 NOTE — Discharge Summary (Signed)
  Discharge summary dictated on 11/25/2013 dictation number is [phone]

## 2013-11-25 NOTE — Discharge Summary (Addendum)
[name], [name]                 ACCOUNT NO.:  [id number]  MEDICAL RECORD NO.:  [id number]  LOCATION:  4E12C                        FACILITY:  [facility]  PHYSICIAN:  Brealyn Baril N. Sharyn Lull, M.D. DATE OF BIRTH:  05/19/1945  DATE OF ADMISSION:  11/20/2013 DATE OF DISCHARGE:  11/25/2013                              DISCHARGE SUMMARY   ADMITTING DIAGNOSES: 1. Acute-on-chronic anemia with heme-positive stools, rule out     gastrointestinal loss, resolving chronic as the one she is in blood compensated she denied he'll sign decompensated systolic heart     failure. 2. Hypertension. 3. Non-insulin-dependent diabetes mellitus. 4. Chronic kidney disease, stage IV. 5. Anemia of chronic disease. 6. Hypercholesteremia. 7. Diabetic nephropathy. 8. Diabetic neuropathy. 9. Degenerative joint disease.  DISCHARGE DIAGNOSES: 1. Chronic anemia with heme-positive stool, GI workup negative in the     past. 2. Compensated systolic heart failure. 3. Hypertension. 4. Non-insulin-dependent diabetes mellitus. 5. Chronic kidney disease, stage IV. 6. Anemia of chronic disease. 7. Hypercholesteremia. 8. Diabetic nephropathy. 9. Diabetic neuropathy. 10.Degenerative joint disease.  DISCHARGE HOME MEDICATIONS: 1. Protonix 40 mg 1 tablet twice daily. 2. Glipizide 5 mg 1 tablet daily. 3. Atorvastatin 80 mg 1 tablet daily. 4. Carvedilol 25 mg 1 tablet twice daily. 5. Ferrous sulfate 1 tablet 325 mg 3 times daily. 6. Lasix 80 mg twice daily. 7. Isosorbide/hydralazine 20/37.5 mg 3 times daily. 8. K-Dur 20 mEq daily. 9. Travatan eyedrops as before.  The patient has been advised to stop     aspirin.  DIET:  Low salt, low cholesterol, 1800 calories ADA diet.  The patient has been advised to monitor blood sugar and blood pressure daily.  FOLLOWUP:  Follow up with GI and Renal Service as scheduled.  Follow up with me in 1 week.  We will check her renal function, CBC in 1 week as outpatient.  CONDITION AT  DISCHARGE:  Stable.  BRIEF HISTORY AND HOSPITAL COURSE:  [name] is 68 year old female with past medical history significant for nonischemic dilated cardiomyopathy, history of recurrent congestive heart failure secondary to systolic dysfunction, hypertension, diabetes mellitus, chronic kidney disease, anemia of chronic disease, hypercholesteremia, diabetic neuropathy, diabetic nephropathy, refused for evaluation for hemodialysis in the past, she came to the ER complaining of black tarry stool for last few weeks associated with feeling tired and fatigued. The patient denies any abdominal pain.  Denies nausea or vomiting. Denies any chest pain.  Complains of occasional shortness of breath with exertion.  States overall feels better after increasing her Lasix dose. Denies any fever or chills.  Denies any urinary complaints.  The patient was noted to have heme-positive stool and hemoglobin of 8.8, which was not markedly lower as compared to hemoglobin 1 month ago, which was 9.0. The patient did have hemoglobin drop last month, requiring 1 unit of packed RBCs and had appointment to see GI as outpatient in December, but due to black tarry stool, she decided to come to ED.  PAST MEDICAL HISTORY:  As above.  PHYSICAL EXAMINATION:  GENERAL:  She was alert, awake, oriented x3, in no acute distress.  VITAL SIGNS:  Blood pressure was 186/84, pulse was 77.  She was afebrile.  HEENT:  Conjunctiva was pink.  NECK:  Supple.  No JVD.  No bruit.  LUNGS:  She has decreased breath sounds at bases.  CARDIOVASCULAR:  S1, S2 was normal.  There was soft systolic murmur. There was no S3 gallop.  ABDOMEN:  Soft.  Bowel sounds present.  Nontender.  EXTREMITIES:  There was no clubbing, cyanosis.  There was trace edema noted.  LABORATORY DATA:  Sodium was 140, potassium 3.4, BUN 60, creatinine 3.41.  Hemoglobin was 8.3, hematocrit 25, white count of 6.7.  Repeat hemoglobin was 7.5 yesterday,  hematocrit 22.9, white count of 6.1.  The patient did receive 1 unit of packed RBCs.  Posttransfusion, hemoglobin today is 9.2, hematocrit 28.0, white count of 6.8.  Sodium is 142, potassium 3.7, glucose was 126, BUN 69, creatinine 3.80, which is trending down.  Her blood sugar has been between 90-164.  The patient did have one episode of hypoglycemia, blood sugar of 59.  Her glipizide was held during the hospital stay.  BRIEF HOSPITAL COURSE:  The patient was admitted to telemetry unit.  GI consultation was obtained.  The patient was felt stable from GI point of view and did not require any endoscopy or colonoscopy during the hospital stay.  The patient had extensive GI workup in the past as outpatient.  The patient's hemoglobin did drop to 7.5 g, requiring 1 unit of packed RBCs, with appropriate increase of her hemoglobin to 9.2. The patient did not have any further black tarry stools during the hospital stay.  The patient has been ambulating in room and hallway without any problems.  The patient will be discharged home on above medications and will be followed up in my office in 1 week and she will follow up with GI and Renal Service as scheduled as outpatient.     Eduardo Osier. Sharyn Lull, M.D.     [facility]  D:  11/25/2013  T:  11/25/2013  Job:  [id number]

## 2013-12-13 IMAGING — CR DG CHEST 1V PORT
1 series · 1 of 1 positions shown · non-contrast
Comparison: Prior chest x-ray 10/14/2010

CLINICAL DATA: Respiratory distress

EXAM:
PORTABLE CHEST - 1 VIEW

[view not recorded]
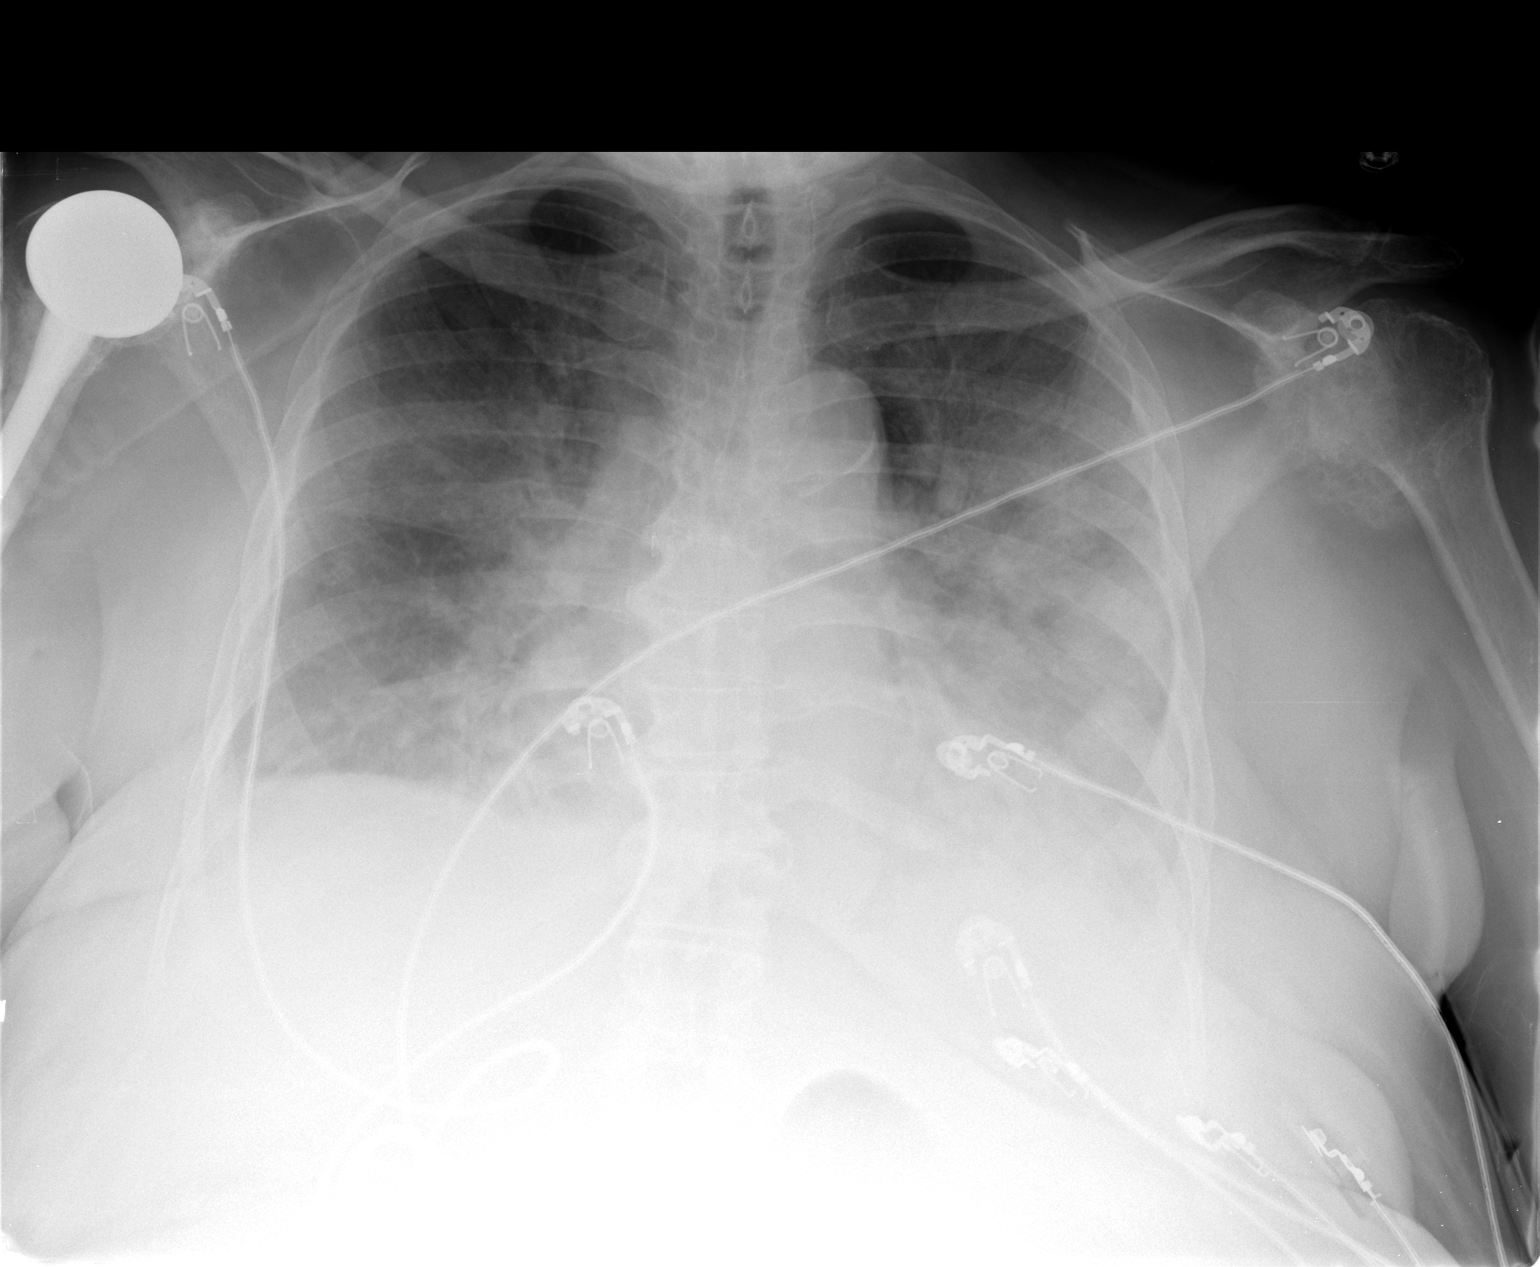

[1 of 1 positions shown; findings below may reference images not displayed]

FINDINGS: Bilateral interstitial and airspace opacities in the mid and lower
lungs in a predominantly perihilar and lower lobe distribution.
Slightly enlarged cardiopericardial silhouette. Atherosclerotic
calcifications are noted in the transverse aorta. Surgical changes
of prior right shoulder arthroplasty. There are advanced
degenerative osteoarthritic changes in the left glenohumeral joint.
No pneumothorax. No acute osseous abnormality.
IMPRESSION: Imaging findings are most suggestive of mild-moderate CHF.

Multifocal pneumonia could have a similar appearance in the
appropriate clinical setting.

## 2013-12-13 IMAGING — US US RENAL
1 series · 14 of 25 positions shown · non-contrast
Comparison: CT abdomen pelvis dated 06/10/2011

CLINICAL DATA: Acute on chronic renal failure

EXAM:
RENAL/URINARY TRACT ULTRASOUND COMPLETE

[Series 1: us renal · 0.21mm/px · 14 of 42 slices shown]
[im 1/42]
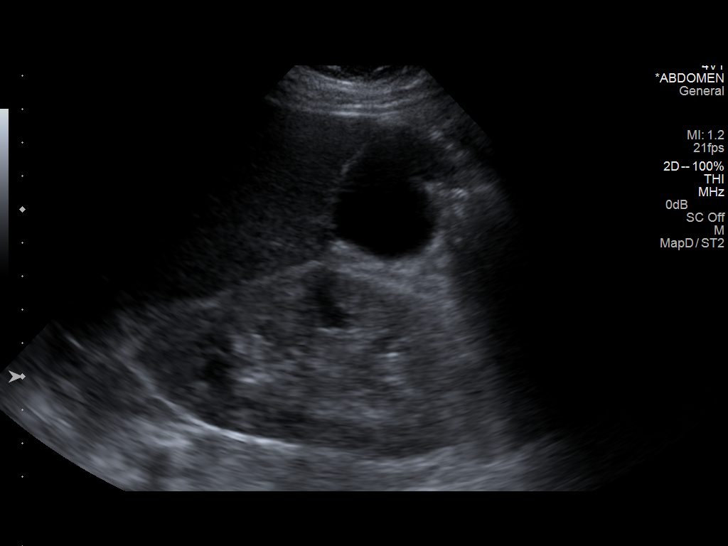
[im 4/42]
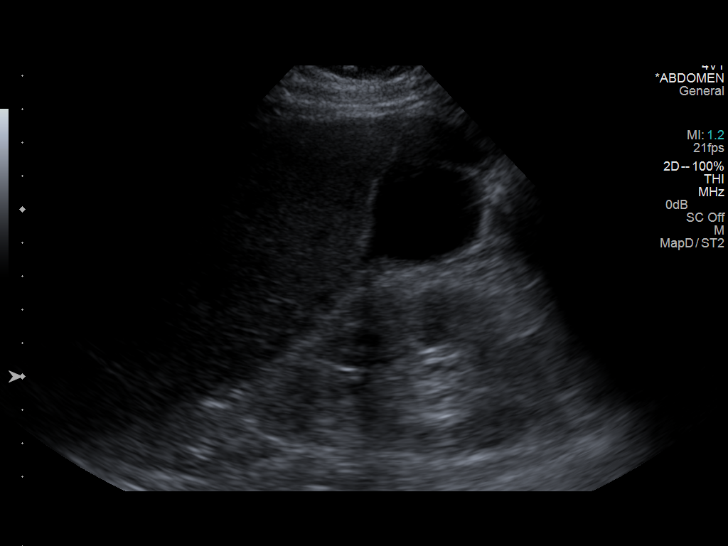
[im 7/42]
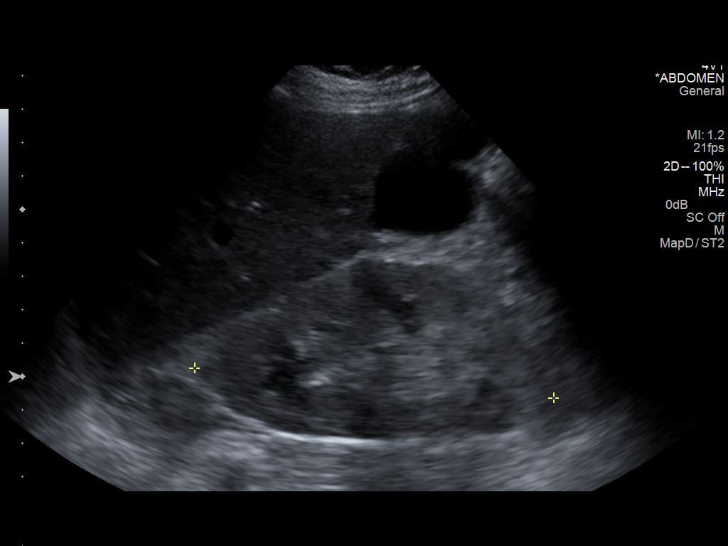
[im 11/42]
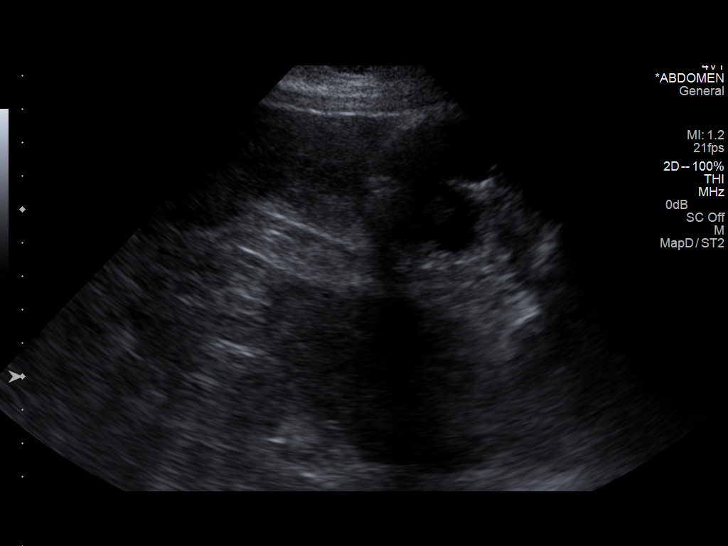
[im 14/42]
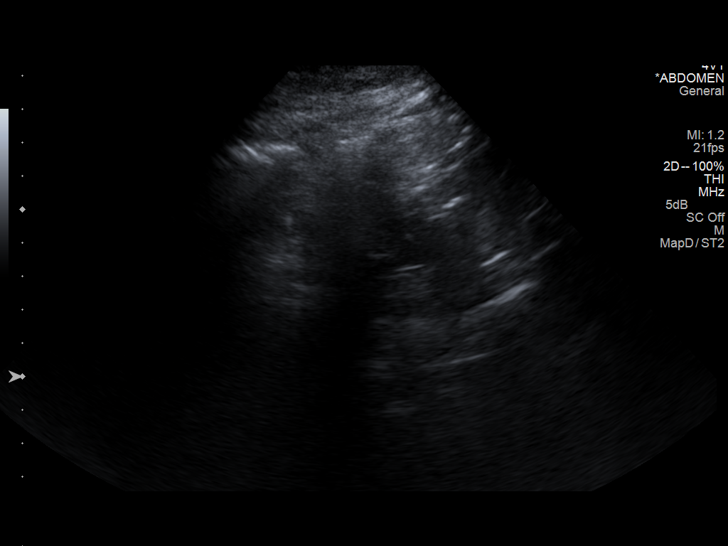
[im 16/42]
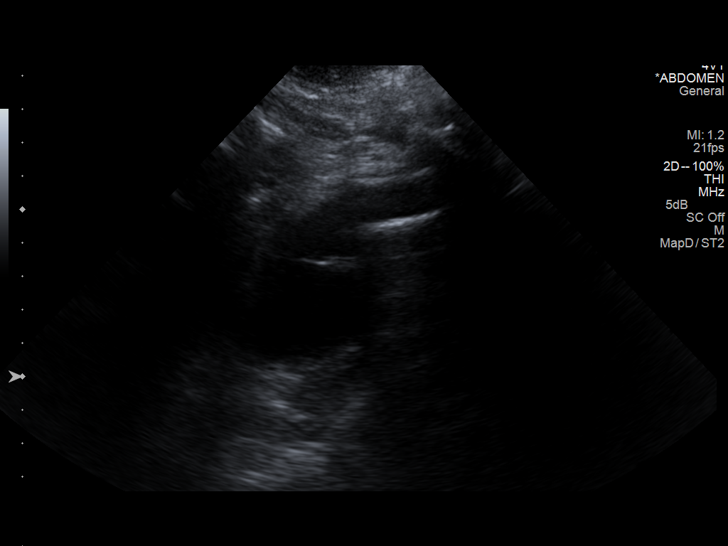
[im 19/42]
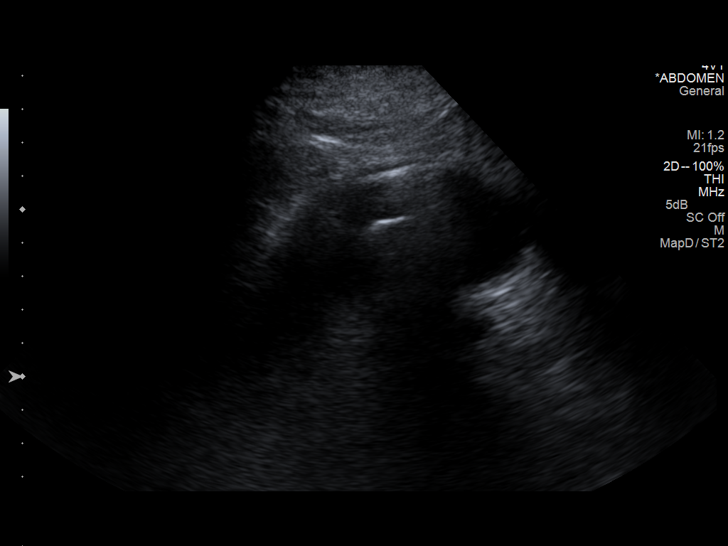
[im 23/42]
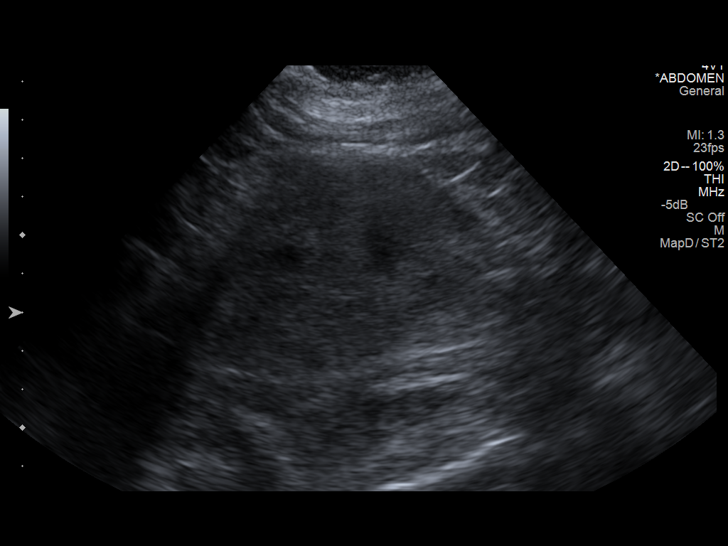
[im 26/42]
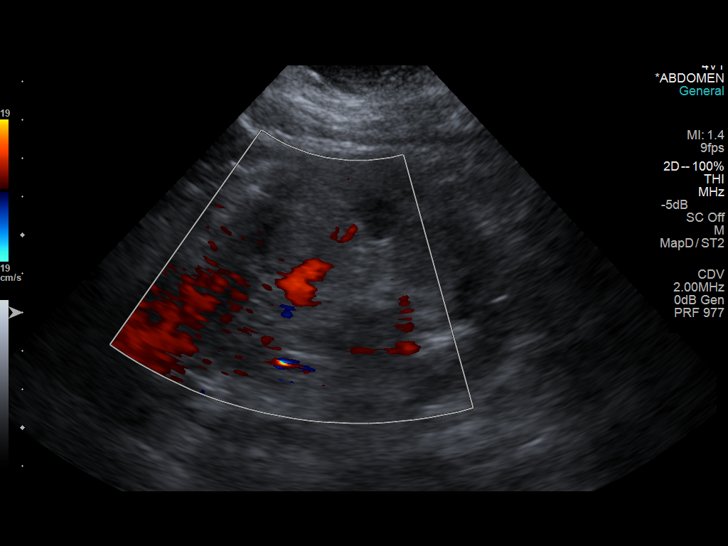
[im 28/42]
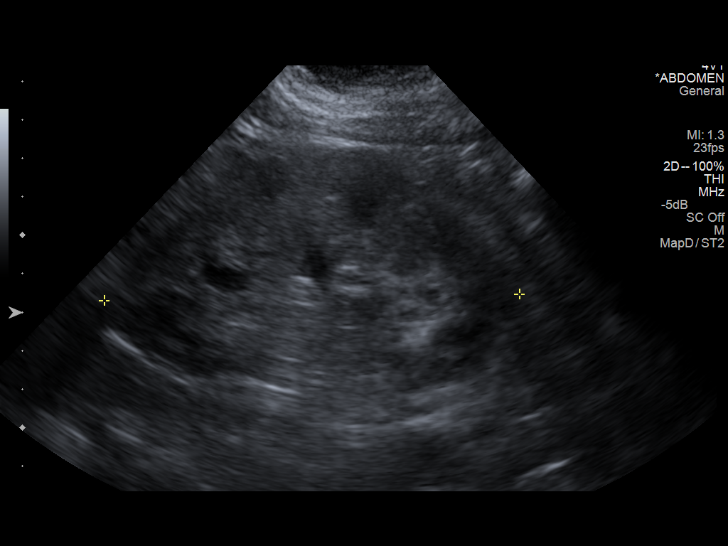
[im 31/42]
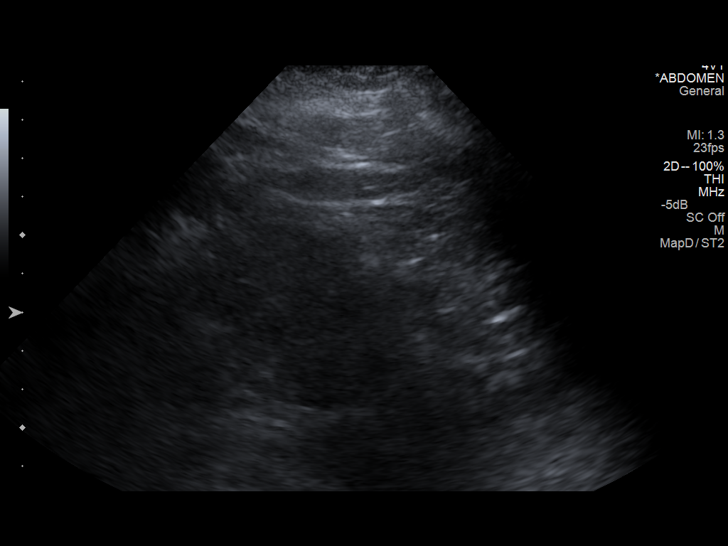
[im 35/42]
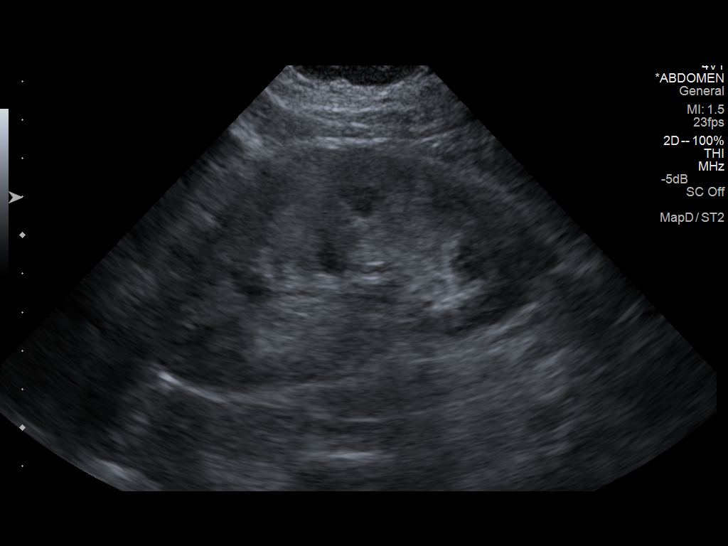
[im 38/42]
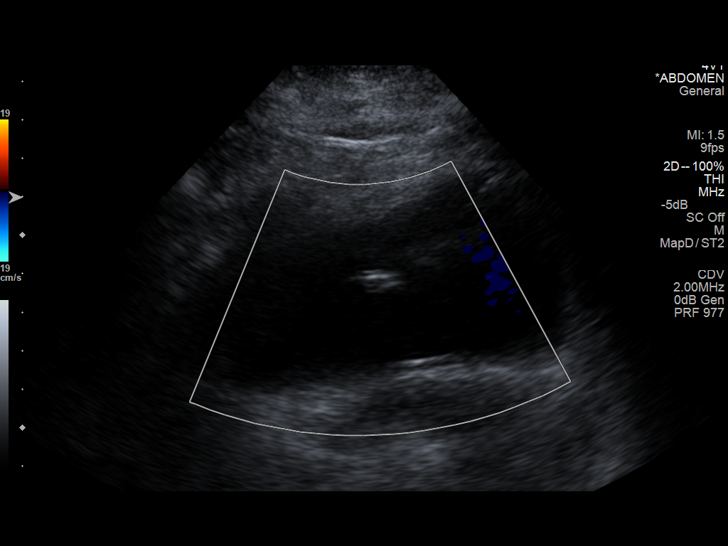
[im 42/42]
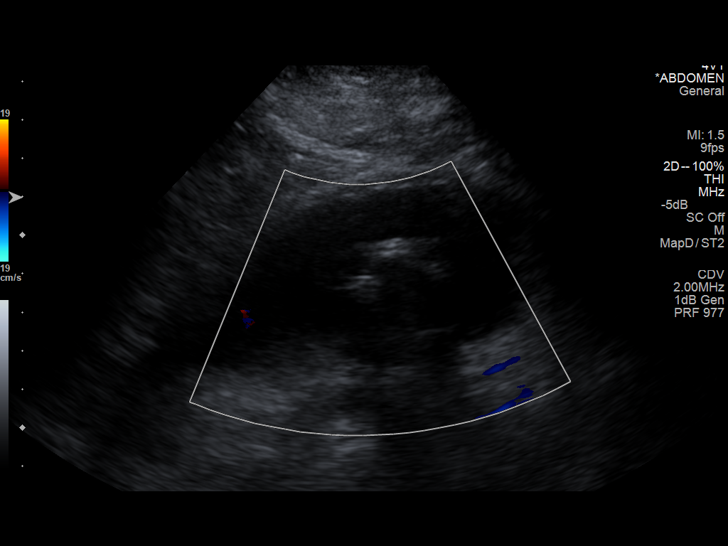

[14 of 25 positions shown; findings below may reference images not displayed]

FINDINGS: Right Kidney

Length: 10.8 cm. Echogenic renal parenchyma, suggesting medical
renal disease. No mass or hydronephrosis.

Left Kidney

Length: 10.8 cm. Echogenic renal parenchyma, suggesting medical
renal disease. No mass or hydronephrosis.

Bladder

Within normal limits. Indwelling Foley catheter.
IMPRESSION: No hydronephrosis.

Echogenic renal parenchyma, suggesting medical renal disease.

Foley catheter within the bladder.

## 2013-12-15 ENCOUNTER — Telehealth: Payer: Self-pay | Admitting: Gastroenterology

## 2013-12-15 NOTE — Telephone Encounter (Signed)
Pt advised to keep appt as scheduled 

## 2013-12-18 ENCOUNTER — Encounter: Payer: Self-pay | Admitting: Gastroenterology

## 2013-12-18 ENCOUNTER — Ambulatory Visit (INDEPENDENT_AMBULATORY_CARE_PROVIDER_SITE_OTHER): Payer: Medicare Other | Admitting: Gastroenterology

## 2013-12-18 VITALS — BP 168/88 | HR 94 | Ht 60.0 in | Wt 187.0 lb

## 2013-12-18 DIAGNOSIS — D631 Anemia in chronic kidney disease: Secondary | ICD-10-CM

## 2013-12-18 DIAGNOSIS — N189 Chronic kidney disease, unspecified: Secondary | ICD-10-CM

## 2013-12-18 NOTE — Progress Notes (Signed)
Review of pertinent gastrointestinal problems: 1. 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 No EGD on records.   HPI: This is a  very pleasant 68 year old woman whom I saw in the hospital in consultation last month. Her stools turned black after she started taking iron supplements. She was very clear about the timing of the dark stools. She has chronic anemia likely from chronic renal insufficiency. Her stool is Hemoccult positive for tobacco, rechecked later on during that admission her Hemoccult was negative. She has had previous colonoscopy. She stopped the iron pills last pill was yesterday.  Was given new prescriptions by kidney doctor yesterday, was told to stop iron supplements.   Past Medical History  Diagnosis Date  . Diabetes mellitus   . Hypertension   . Arthritis   . Sleep apnea   . CKD (chronic kidney disease)   . CHF (congestive heart failure)   . Hypercholesteremia   . Diabetic neuropathy   . DJD (degenerative joint disease)   . Anemia     Past Surgical History  Procedure Laterality Date  . Total shoulder replacement      right    Current Outpatient Prescriptions  Medication Sig Dispense Refill  . atorvastatin (LIPITOR) 80 MG tablet Take 40 mg by mouth daily.      . furosemide (LASIX) 80 MG tablet Take 1 tablet (80 mg total) by mouth 2 (two) times daily.  60 tablet  3  . glipiZIDE (GLUCOTROL XL) 5 MG 24 hr tablet Take 1 tablet (5 mg total) by mouth 2 (two) times daily.  30 tablet  3  . isosorbide-hydrALAZINE (BIDIL) 20-37.5 MG per tablet Take 1 tablet by mouth 3 (three) times daily.  90 tablet  3  . pantoprazole (PROTONIX) 40 MG tablet Take 1 tablet (40 mg total) by mouth 2 (two) times daily.  60 tablet  3   No current facility-administered medications for this visit.    Allergies as of 12/18/2013 - Review Complete 12/18/2013  Allergen Reaction Noted  . Penicillins Anaphylaxis   . Lisinopril Cough     Family History  Problem Relation Age of  Onset  . Diabetes Brother     History   Social History  . Marital Status: Widowed    Spouse Name: N/A    Number of Children: 5  . Years of Education: N/A   Occupational History  . retried    Social History Main Topics  . Smoking status: Former Games developer  . Smokeless tobacco: Never Used  . Alcohol Use: No  . Drug Use: No  . Sexual Activity: Not on file   Other Topics Concern  . Not on file   Social History Narrative  . No narrative on file      Physical Exam: BP 168/88  Pulse 94  Ht 5' (1.524 m)  Wt 187 lb (84.823 kg)  BMI 36.52 kg/m2  SpO2 97% Constitutional: generally well-appearing Psychiatric: alert and oriented x3 Abdomen: soft, nontender, nondistended, no obvious ascites, no peritoneal signs, normal bowel sounds     Assessment and plan: 68 y.o. female with  anemia  I think her anemia is from her renal disease. Her stools turned black only after she started taking iron. Yesterday she was in her nephrologist office and he recommended she stop taking the iron supplements. She tells me he prescribed 2 new medicines that she does not recall either of them were. I asked her to call this office if she has a return of black stools  despite the fact that she is no longer taking oral iron supplement. At that point or consider EGD for now I see no need for it.

## 2013-12-18 NOTE — Patient Instructions (Signed)
Please call if your stools become black again despite stopping iron supplements.

## 2014-01-10 IMAGING — CR DG CHEST 2V
2 series · 2 of 2 positions shown · non-contrast
Comparison: 10/23/2013

CLINICAL DATA: Shortness of breath, weakness, melena, history
hypertension, diabetes, smoking

EXAM:
CHEST  2 VIEW

[w chest pa]
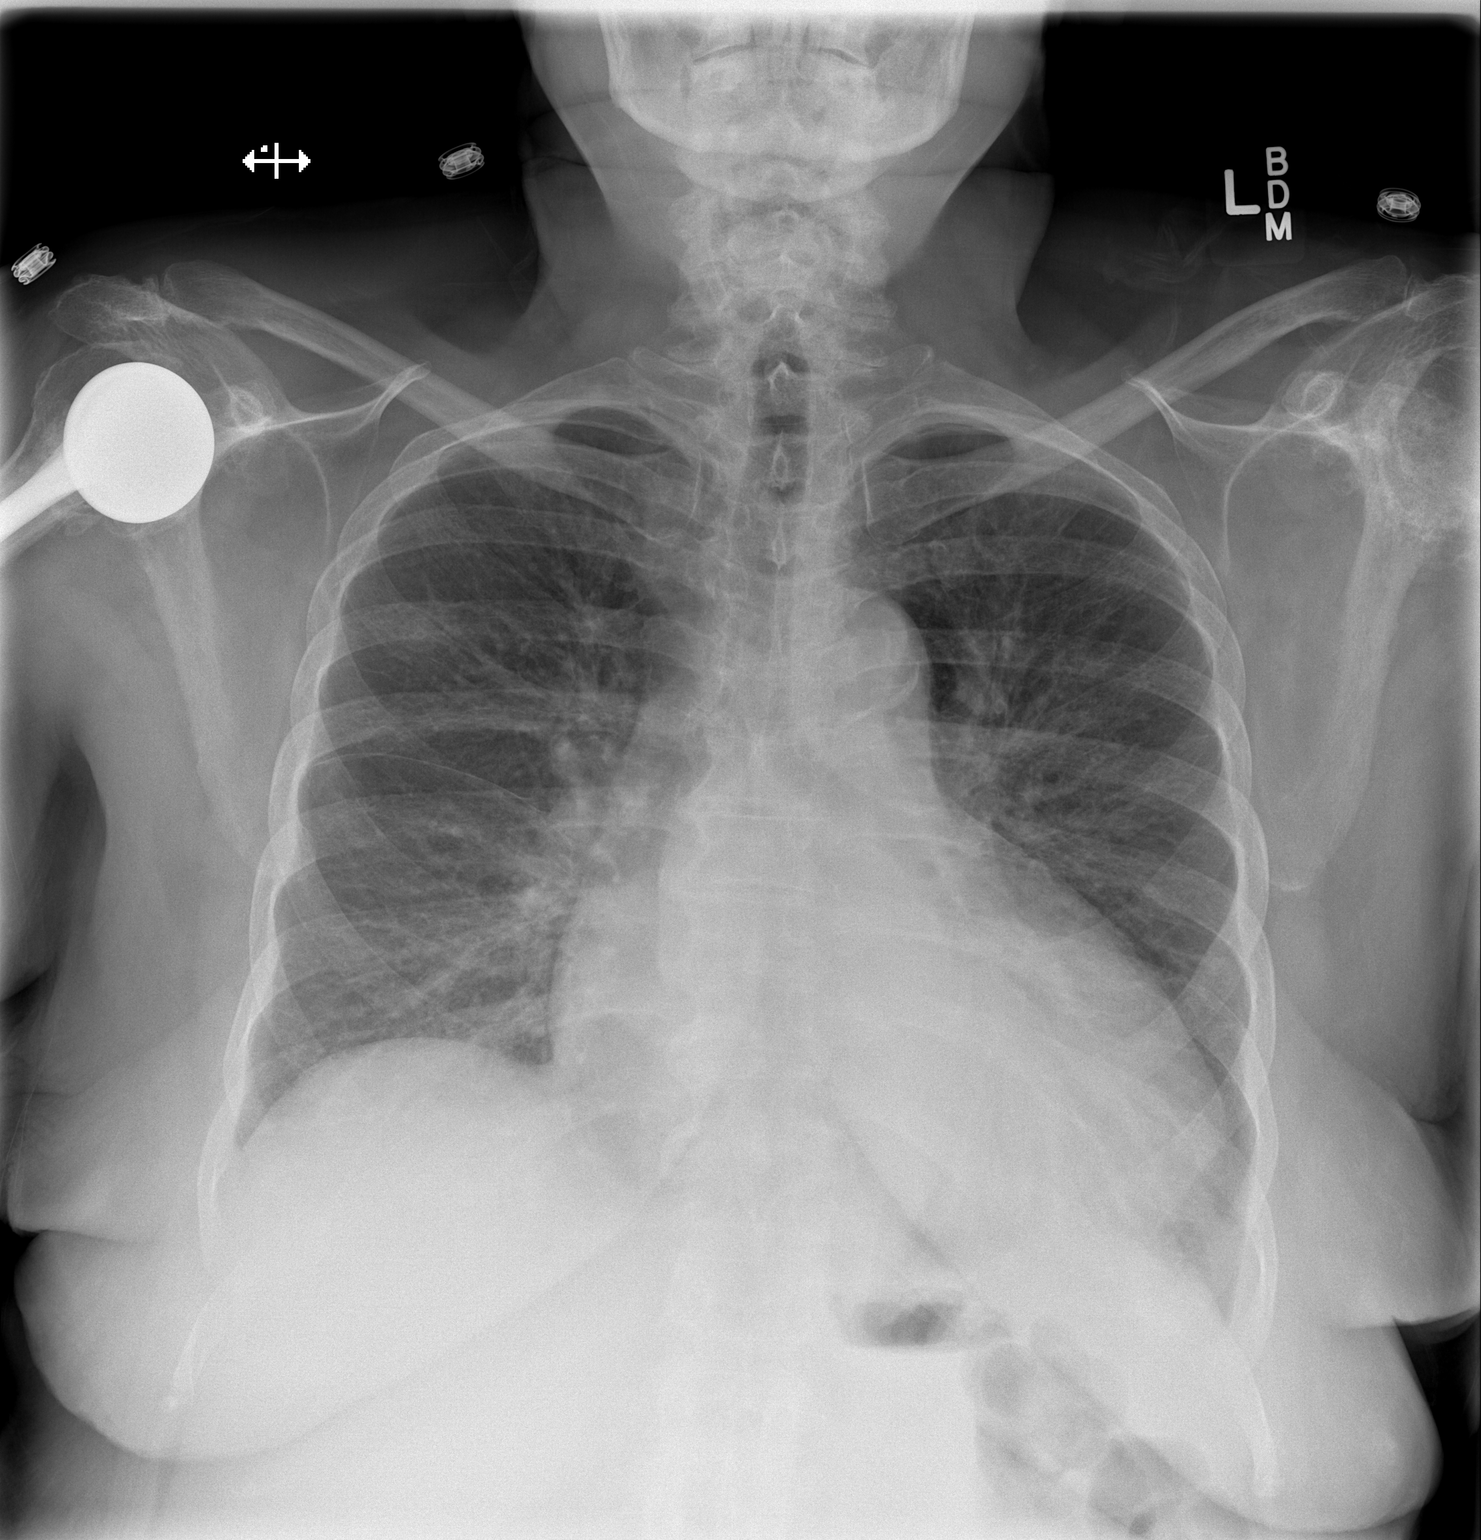

[w chest lat]
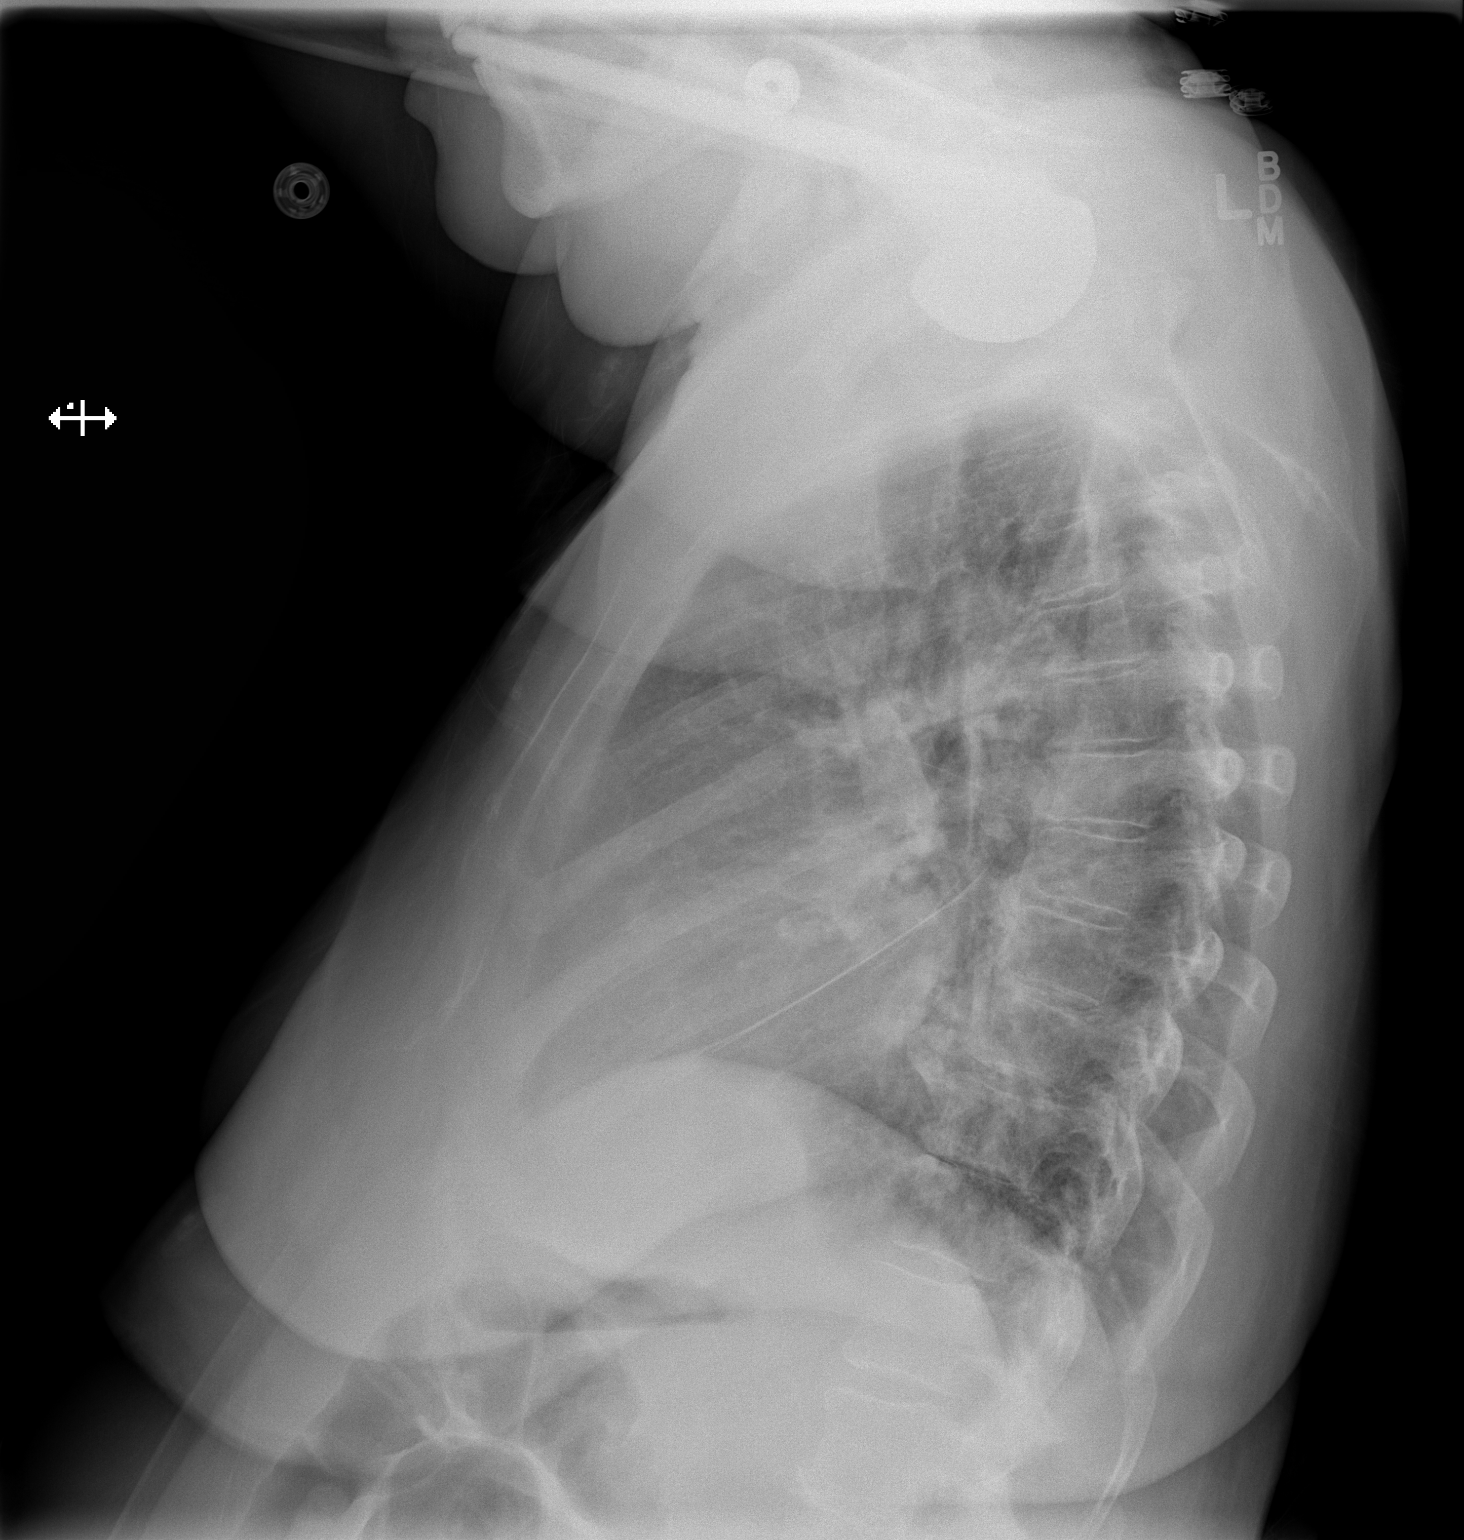

[2 of 2 positions shown; findings below may reference images not displayed]

FINDINGS: Enlargement of cardiac silhouette with pulmonary vascular
congestion.

Improved pulmonary infiltrates since previous exam with minimal
residual interstitial infiltrate.

No pleural effusion or pneumothorax.

Diffuse osseous demineralization with note of a right shoulder
prosthesis.

Atherosclerotic calcification aorta.
IMPRESSION: Improved pulmonary infiltrates since previous exam.

## 2014-03-05 ENCOUNTER — Encounter (HOSPITAL_COMMUNITY)
Admission: RE | Admit: 2014-03-05 | Discharge: 2014-03-05 | Disposition: A | Discharge status: Home / Self Care | Payer: Medicare Other | Source: Ambulatory Visit | Attending: Nephrology | Admitting: Nephrology

## 2014-03-05 DIAGNOSIS — N184 Chronic kidney disease, stage 4 (severe): Secondary | ICD-10-CM | POA: Insufficient documentation

## 2014-03-05 DIAGNOSIS — D638 Anemia in other chronic diseases classified elsewhere: Secondary | ICD-10-CM | POA: Insufficient documentation

## 2014-03-05 MED ORDER — EPOETIN ALFA 10000 UNIT/ML IJ SOLN
5000.0000 [IU] | INTRAMUSCULAR | Status: DC
  Administered 2014-03-05: 5000 [IU] via SUBCUTANEOUS

## 2014-03-05 MED ORDER — EPOETIN ALFA 10000 UNIT/ML IJ SOLN
INTRAMUSCULAR | Status: AC
  Filled 2014-03-05: qty 1

## 2014-03-08 LAB — POCT HEMOGLOBIN-HEMACUE: Hemoglobin: 8.4 g/dL — ABNORMAL LOW (ref 12.0–15.0)

## 2014-03-12 ENCOUNTER — Encounter (HOSPITAL_COMMUNITY)
Admission: RE | Admit: 2014-03-12 | Discharge: 2014-03-12 | Disposition: A | Discharge status: Home / Self Care | Payer: Medicare Other | Source: Ambulatory Visit | Attending: Nephrology | Admitting: Nephrology

## 2014-03-12 LAB — IRON AND TIBC
IRON: 36 ug/dL — AB (ref 42–135)
Saturation Ratios: 13 % — ABNORMAL LOW (ref 20–55)
TIBC: 281 ug/dL (ref 250–470)
UIBC: 245 ug/dL (ref 125–400)

## 2014-03-12 LAB — POCT HEMOGLOBIN-HEMACUE: Hemoglobin: 8.9 g/dL — ABNORMAL LOW (ref 12.0–15.0)

## 2014-03-12 LAB — FERRITIN: Ferritin: 76 ng/mL (ref 10–291)

## 2014-03-12 MED ORDER — EPOETIN ALFA 10000 UNIT/ML IJ SOLN
5000.0000 [IU] | INTRAMUSCULAR | Status: DC
  Administered 2014-03-12: 5000 [IU] via SUBCUTANEOUS

## 2014-03-12 MED ORDER — EPOETIN ALFA 10000 UNIT/ML IJ SOLN
INTRAMUSCULAR | Status: AC
  Filled 2014-03-12: qty 1

## 2014-03-19 ENCOUNTER — Encounter (HOSPITAL_COMMUNITY)
Admission: RE | Admit: 2014-03-19 | Discharge: 2014-03-19 | Disposition: A | Discharge status: Home / Self Care | Payer: Medicare Other | Source: Ambulatory Visit | Attending: Nephrology | Admitting: Nephrology

## 2014-03-19 LAB — POCT HEMOGLOBIN-HEMACUE: Hemoglobin: 9.5 g/dL — ABNORMAL LOW (ref 12.0–15.0)

## 2014-03-19 MED ORDER — EPOETIN ALFA 10000 UNIT/ML IJ SOLN
INTRAMUSCULAR | Status: AC
  Filled 2014-03-19: qty 1

## 2014-03-19 MED ORDER — EPOETIN ALFA 10000 UNIT/ML IJ SOLN
5000.0000 [IU] | INTRAMUSCULAR | Status: DC
  Administered 2014-03-19: 5000 [IU] via SUBCUTANEOUS

## 2014-03-26 ENCOUNTER — Encounter (HOSPITAL_COMMUNITY)
Admission: RE | Admit: 2014-03-26 | Discharge: 2014-03-26 | Disposition: A | Discharge status: Home / Self Care | Payer: Medicare Other | Source: Ambulatory Visit | Attending: Nephrology | Admitting: Nephrology

## 2014-03-26 LAB — POCT HEMOGLOBIN-HEMACUE: Hemoglobin: 9.2 g/dL — ABNORMAL LOW (ref 12.0–15.0)

## 2014-03-26 MED ORDER — EPOETIN ALFA 10000 UNIT/ML IJ SOLN
INTRAMUSCULAR | Status: AC
  Filled 2014-03-26: qty 1

## 2014-03-26 MED ORDER — EPOETIN ALFA 10000 UNIT/ML IJ SOLN
5000.0000 [IU] | INTRAMUSCULAR | Status: DC
  Administered 2014-03-26: 11:00:00 via SUBCUTANEOUS

## 2014-04-02 ENCOUNTER — Encounter (HOSPITAL_COMMUNITY)
Admission: RE | Admit: 2014-04-02 | Discharge: 2014-04-02 | Disposition: A | Discharge status: Home / Self Care | Payer: Medicare Other | Source: Ambulatory Visit | Attending: Nephrology | Admitting: Nephrology

## 2014-04-02 ENCOUNTER — Encounter (HOSPITAL_COMMUNITY): Payer: Medicare Other

## 2014-04-02 DIAGNOSIS — N184 Chronic kidney disease, stage 4 (severe): Secondary | ICD-10-CM | POA: Insufficient documentation

## 2014-04-02 DIAGNOSIS — D638 Anemia in other chronic diseases classified elsewhere: Secondary | ICD-10-CM | POA: Insufficient documentation

## 2014-04-02 LAB — POCT HEMOGLOBIN-HEMACUE: HEMOGLOBIN: 8.8 g/dL — AB (ref 12.0–15.0)

## 2014-04-02 MED ORDER — EPOETIN ALFA 10000 UNIT/ML IJ SOLN
5000.0000 [IU] | INTRAMUSCULAR | Status: DC
  Administered 2014-04-02: 5000 [IU] via SUBCUTANEOUS

## 2014-04-02 MED ORDER — EPOETIN ALFA 10000 UNIT/ML IJ SOLN
INTRAMUSCULAR | Status: AC
  Administered 2014-04-02: 5000 [IU] via SUBCUTANEOUS
  Filled 2014-04-02: qty 1

## 2014-04-09 ENCOUNTER — Encounter (HOSPITAL_COMMUNITY)
Admission: RE | Admit: 2014-04-09 | Discharge: 2014-04-09 | Disposition: A | Discharge status: Home / Self Care | Payer: Medicare Other | Source: Ambulatory Visit | Attending: Nephrology | Admitting: Nephrology

## 2014-04-09 LAB — IRON AND TIBC
Iron: 20 ug/dL — ABNORMAL LOW (ref 42–135)
SATURATION RATIOS: 7 % — AB (ref 20–55)
TIBC: 288 ug/dL (ref 250–470)
UIBC: 268 ug/dL (ref 125–400)

## 2014-04-09 LAB — POCT HEMOGLOBIN-HEMACUE: Hemoglobin: 9.3 g/dL — ABNORMAL LOW (ref 12.0–15.0)

## 2014-04-09 MED ORDER — EPOETIN ALFA 10000 UNIT/ML IJ SOLN
5000.0000 [IU] | INTRAMUSCULAR | Status: DC
  Administered 2014-04-09: 5000 [IU] via SUBCUTANEOUS

## 2014-04-09 MED ORDER — EPOETIN ALFA 10000 UNIT/ML IJ SOLN
INTRAMUSCULAR | Status: AC
  Filled 2014-04-09: qty 1

## 2014-04-10 LAB — FERRITIN: Ferritin: 58 ng/mL (ref 10–291)

## 2014-04-15 ENCOUNTER — Other Ambulatory Visit (HOSPITAL_COMMUNITY): Payer: Self-pay | Admitting: *Deleted

## 2014-04-16 ENCOUNTER — Encounter (HOSPITAL_COMMUNITY)
Admission: RE | Admit: 2014-04-16 | Discharge: 2014-04-16 | Disposition: A | Discharge status: Home / Self Care | Payer: Medicare Other | Source: Ambulatory Visit | Attending: Nephrology | Admitting: Nephrology

## 2014-04-16 LAB — POCT HEMOGLOBIN-HEMACUE: Hemoglobin: 9.2 g/dL — ABNORMAL LOW (ref 12.0–15.0)

## 2014-04-16 MED ORDER — FERUMOXYTOL INJECTION 510 MG/17 ML
510.0000 mg | INTRAVENOUS | Status: DC
  Filled 2014-04-16: qty 17

## 2014-04-16 MED ORDER — EPOETIN ALFA 10000 UNIT/ML IJ SOLN
INTRAMUSCULAR | Status: AC
  Filled 2014-04-16: qty 1

## 2014-04-16 MED ORDER — EPOETIN ALFA 10000 UNIT/ML IJ SOLN
5000.0000 [IU] | INTRAMUSCULAR | Status: DC
  Administered 2014-04-16: 5000 [IU] via SUBCUTANEOUS

## 2014-04-16 MED ORDER — SODIUM CHLORIDE 0.9 % IV SOLN
510.0000 mg | Freq: Once | INTRAVENOUS | Status: DC
  Administered 2014-04-16: 510 mg via INTRAVENOUS
  Filled 2014-04-16: qty 17

## 2014-04-22 ENCOUNTER — Other Ambulatory Visit (HOSPITAL_COMMUNITY): Payer: Self-pay | Admitting: *Deleted

## 2014-04-23 ENCOUNTER — Encounter (HOSPITAL_COMMUNITY)
Admission: RE | Admit: 2014-04-23 | Discharge: 2014-04-23 | Disposition: A | Discharge status: Home / Self Care | Payer: Medicare Other | Source: Ambulatory Visit | Attending: Nephrology | Admitting: Nephrology

## 2014-04-23 LAB — POCT HEMOGLOBIN-HEMACUE: Hemoglobin: 9.4 g/dL — ABNORMAL LOW (ref 12.0–15.0)

## 2014-04-23 MED ORDER — EPOETIN ALFA 10000 UNIT/ML IJ SOLN
5000.0000 [IU] | INTRAMUSCULAR | Status: DC
  Administered 2014-04-23: 5000 [IU] via SUBCUTANEOUS

## 2014-04-23 MED ORDER — EPOETIN ALFA 10000 UNIT/ML IJ SOLN
INTRAMUSCULAR | Status: AC
  Filled 2014-04-23: qty 1

## 2014-04-23 MED ORDER — SODIUM CHLORIDE 0.9 % IV SOLN
510.0000 mg | Freq: Once | INTRAVENOUS | Status: AC
  Administered 2014-04-23: 510 mg via INTRAVENOUS
  Filled 2014-04-23: qty 17

## 2014-04-30 ENCOUNTER — Encounter (HOSPITAL_COMMUNITY)
Admission: RE | Admit: 2014-04-30 | Discharge: 2014-04-30 | Disposition: A | Discharge status: Home / Self Care | Payer: Medicare Other | Source: Ambulatory Visit | Attending: Nephrology | Admitting: Nephrology

## 2014-04-30 DIAGNOSIS — N184 Chronic kidney disease, stage 4 (severe): Secondary | ICD-10-CM | POA: Insufficient documentation

## 2014-04-30 DIAGNOSIS — D638 Anemia in other chronic diseases classified elsewhere: Secondary | ICD-10-CM | POA: Insufficient documentation

## 2014-04-30 LAB — POCT HEMOGLOBIN-HEMACUE: HEMOGLOBIN: 9.1 g/dL — AB (ref 12.0–15.0)

## 2014-04-30 MED ORDER — EPOETIN ALFA 10000 UNIT/ML IJ SOLN
INTRAMUSCULAR | Status: AC
  Administered 2014-04-30: 5000 [IU] via SUBCUTANEOUS
  Filled 2014-04-30: qty 1

## 2014-04-30 MED ORDER — EPOETIN ALFA 10000 UNIT/ML IJ SOLN
5000.0000 [IU] | INTRAMUSCULAR | Status: DC
  Administered 2014-04-30: 5000 [IU] via SUBCUTANEOUS

## 2014-05-07 ENCOUNTER — Encounter (HOSPITAL_COMMUNITY)
Admission: RE | Admit: 2014-05-07 | Discharge: 2014-05-07 | Disposition: A | Discharge status: Home / Self Care | Payer: Medicare Other | Source: Ambulatory Visit | Attending: Nephrology | Admitting: Nephrology

## 2014-05-07 LAB — IRON AND TIBC
Iron: 48 ug/dL (ref 42–135)
Saturation Ratios: 21 % (ref 20–55)
TIBC: 230 ug/dL — ABNORMAL LOW (ref 250–470)
UIBC: 182 ug/dL (ref 125–400)

## 2014-05-07 LAB — POCT HEMOGLOBIN-HEMACUE: HEMOGLOBIN: 10.8 g/dL — AB (ref 12.0–15.0)

## 2014-05-07 LAB — FERRITIN: Ferritin: 294 ng/mL — ABNORMAL HIGH (ref 10–291)

## 2014-05-07 MED ORDER — EPOETIN ALFA 10000 UNIT/ML IJ SOLN
INTRAMUSCULAR | Status: AC
Start: 2014-05-07 — End: 2014-05-07
  Filled 2014-05-07: qty 1

## 2014-05-07 MED ORDER — EPOETIN ALFA 10000 UNIT/ML IJ SOLN
5000.0000 [IU] | INTRAMUSCULAR | Status: DC
  Administered 2014-05-07: 10000 [IU] via SUBCUTANEOUS

## 2014-05-14 ENCOUNTER — Encounter (HOSPITAL_COMMUNITY)
Admission: RE | Admit: 2014-05-14 | Discharge: 2014-05-14 | Disposition: A | Discharge status: Home / Self Care | Payer: Medicare Other | Source: Ambulatory Visit | Attending: Nephrology | Admitting: Nephrology

## 2014-05-14 LAB — POCT HEMOGLOBIN-HEMACUE: HEMOGLOBIN: 11 g/dL — AB (ref 12.0–15.0)

## 2014-05-14 MED ORDER — EPOETIN ALFA 10000 UNIT/ML IJ SOLN
5000.0000 [IU] | INTRAMUSCULAR | Status: DC
  Administered 2014-05-14: 5000 [IU] via SUBCUTANEOUS

## 2014-05-14 MED ORDER — EPOETIN ALFA 10000 UNIT/ML IJ SOLN
INTRAMUSCULAR | Status: AC
  Filled 2014-05-14: qty 1

## 2014-05-21 ENCOUNTER — Encounter (HOSPITAL_COMMUNITY)
Admission: RE | Admit: 2014-05-21 | Discharge: 2014-05-21 | Disposition: A | Discharge status: Home / Self Care | Payer: Medicare Other | Source: Ambulatory Visit | Attending: Nephrology | Admitting: Nephrology

## 2014-05-21 LAB — POCT HEMOGLOBIN-HEMACUE: Hemoglobin: 9.8 g/dL — ABNORMAL LOW (ref 12.0–15.0)

## 2014-05-21 MED ORDER — EPOETIN ALFA 10000 UNIT/ML IJ SOLN
5000.0000 [IU] | INTRAMUSCULAR | Status: DC
  Administered 2014-05-21: 09:00:00 via SUBCUTANEOUS

## 2014-05-21 MED ORDER — EPOETIN ALFA 10000 UNIT/ML IJ SOLN
INTRAMUSCULAR | Status: AC
  Filled 2014-05-21: qty 1

## 2014-05-28 ENCOUNTER — Encounter (HOSPITAL_COMMUNITY)
Admission: RE | Admit: 2014-05-28 | Discharge: 2014-05-28 | Disposition: A | Discharge status: Home / Self Care | Payer: Medicare Other | Source: Ambulatory Visit | Attending: Nephrology | Admitting: Nephrology

## 2014-05-28 ENCOUNTER — Encounter (HOSPITAL_COMMUNITY): Payer: Medicare Other

## 2014-05-28 LAB — POCT HEMOGLOBIN-HEMACUE: Hemoglobin: 10.7 g/dL — ABNORMAL LOW (ref 12.0–15.0)

## 2014-05-28 MED ORDER — EPOETIN ALFA 10000 UNIT/ML IJ SOLN
INTRAMUSCULAR | Status: AC
  Filled 2014-05-28: qty 1

## 2014-05-28 MED ORDER — EPOETIN ALFA 10000 UNIT/ML IJ SOLN
5000.0000 [IU] | INTRAMUSCULAR | Status: DC
  Administered 2014-05-28: 10000 [IU] via SUBCUTANEOUS

## 2014-06-04 ENCOUNTER — Encounter (HOSPITAL_COMMUNITY)
Admission: RE | Admit: 2014-06-04 | Discharge: 2014-06-04 | Disposition: A | Discharge status: Home / Self Care | Payer: Medicare Other | Source: Ambulatory Visit | Attending: Nephrology | Admitting: Nephrology

## 2014-06-04 DIAGNOSIS — D638 Anemia in other chronic diseases classified elsewhere: Secondary | ICD-10-CM | POA: Insufficient documentation

## 2014-06-04 DIAGNOSIS — N184 Chronic kidney disease, stage 4 (severe): Secondary | ICD-10-CM | POA: Insufficient documentation

## 2014-06-04 LAB — IRON AND TIBC
IRON: 36 ug/dL — AB (ref 42–135)
Saturation Ratios: 15 % — ABNORMAL LOW (ref 20–55)
TIBC: 233 ug/dL — ABNORMAL LOW (ref 250–470)
UIBC: 197 ug/dL (ref 125–400)

## 2014-06-04 LAB — POCT HEMOGLOBIN-HEMACUE: Hemoglobin: 10.2 g/dL — ABNORMAL LOW (ref 12.0–15.0)

## 2014-06-04 LAB — FERRITIN: Ferritin: 187 ng/mL (ref 10–291)

## 2014-06-04 MED ORDER — EPOETIN ALFA 10000 UNIT/ML IJ SOLN
5000.0000 [IU] | INTRAMUSCULAR | Status: DC
  Administered 2014-06-04: 5000 [IU] via SUBCUTANEOUS

## 2014-06-04 MED ORDER — EPOETIN ALFA 10000 UNIT/ML IJ SOLN
INTRAMUSCULAR | Status: AC
  Filled 2014-06-04: qty 1

## 2014-06-11 ENCOUNTER — Encounter (HOSPITAL_COMMUNITY)
Admission: RE | Admit: 2014-06-11 | Discharge: 2014-06-11 | Disposition: A | Discharge status: Home / Self Care | Payer: Medicare Other | Source: Ambulatory Visit | Attending: Nephrology | Admitting: Nephrology

## 2014-06-11 LAB — POCT HEMOGLOBIN-HEMACUE: Hemoglobin: 10.9 g/dL — ABNORMAL LOW (ref 12.0–15.0)

## 2014-06-11 MED ORDER — EPOETIN ALFA 10000 UNIT/ML IJ SOLN
5000.0000 [IU] | INTRAMUSCULAR | Status: DC
  Administered 2014-06-11: 5000 [IU] via SUBCUTANEOUS

## 2014-06-11 MED ORDER — EPOETIN ALFA 10000 UNIT/ML IJ SOLN
INTRAMUSCULAR | Status: AC
  Administered 2014-06-11: 5000 [IU] via SUBCUTANEOUS
  Filled 2014-06-11: qty 1

## 2014-06-18 ENCOUNTER — Encounter (HOSPITAL_COMMUNITY)
Admission: RE | Admit: 2014-06-18 | Discharge: 2014-06-18 | Disposition: A | Discharge status: Home / Self Care | Payer: Medicare Other | Source: Ambulatory Visit | Attending: Nephrology | Admitting: Nephrology

## 2014-06-18 MED ORDER — EPOETIN ALFA 10000 UNIT/ML IJ SOLN
INTRAMUSCULAR | Status: AC
  Administered 2014-06-18: 5000 [IU] via SUBCUTANEOUS
  Filled 2014-06-18: qty 1

## 2014-06-18 MED ORDER — EPOETIN ALFA 10000 UNIT/ML IJ SOLN
5000.0000 [IU] | INTRAMUSCULAR | Status: DC
  Administered 2014-06-18: 5000 [IU] via SUBCUTANEOUS

## 2014-06-21 LAB — POCT HEMOGLOBIN-HEMACUE: HEMOGLOBIN: 10.3 g/dL — AB (ref 12.0–15.0)

## 2014-06-25 ENCOUNTER — Encounter (HOSPITAL_COMMUNITY)
Admission: RE | Admit: 2014-06-25 | Discharge: 2014-06-25 | Disposition: A | Discharge status: Home / Self Care | Payer: Medicare Other | Source: Ambulatory Visit | Attending: Nephrology | Admitting: Nephrology

## 2014-06-25 DIAGNOSIS — N039 Chronic nephritic syndrome with unspecified morphologic changes: Principal | ICD-10-CM

## 2014-06-25 DIAGNOSIS — D631 Anemia in chronic kidney disease: Secondary | ICD-10-CM | POA: Insufficient documentation

## 2014-06-25 DIAGNOSIS — N185 Chronic kidney disease, stage 5: Secondary | ICD-10-CM | POA: Diagnosis not present

## 2014-06-25 MED ORDER — EPOETIN ALFA 10000 UNIT/ML IJ SOLN
5000.0000 [IU] | INTRAMUSCULAR | Status: DC
  Administered 2014-06-25: 5000 [IU] via SUBCUTANEOUS

## 2014-06-25 MED ORDER — EPOETIN ALFA 10000 UNIT/ML IJ SOLN
INTRAMUSCULAR | Status: AC
  Filled 2014-06-25: qty 1

## 2014-06-28 LAB — POCT HEMOGLOBIN-HEMACUE: HEMOGLOBIN: 10.6 g/dL — AB (ref 12.0–15.0)

## 2014-07-01 ENCOUNTER — Encounter (HOSPITAL_COMMUNITY)
Admission: RE | Admit: 2014-07-01 | Discharge: 2014-07-01 | Disposition: A | Discharge status: Home / Self Care | Payer: Medicare Other | Source: Ambulatory Visit | Attending: Nephrology | Admitting: Nephrology

## 2014-07-01 DIAGNOSIS — N184 Chronic kidney disease, stage 4 (severe): Secondary | ICD-10-CM | POA: Insufficient documentation

## 2014-07-01 DIAGNOSIS — D638 Anemia in other chronic diseases classified elsewhere: Secondary | ICD-10-CM | POA: Diagnosis present

## 2014-07-01 LAB — IRON AND TIBC
IRON: 45 ug/dL (ref 42–135)
SATURATION RATIOS: 20 % (ref 20–55)
TIBC: 229 ug/dL — ABNORMAL LOW (ref 250–470)
UIBC: 184 ug/dL (ref 125–400)

## 2014-07-01 LAB — POCT HEMOGLOBIN-HEMACUE: Hemoglobin: 10.9 g/dL — ABNORMAL LOW (ref 12.0–15.0)

## 2014-07-01 LAB — FERRITIN: Ferritin: 138 ng/mL (ref 10–291)

## 2014-07-01 MED ORDER — EPOETIN ALFA 10000 UNIT/ML IJ SOLN: 5000.0000 [IU] | INTRAMUSCULAR | Status: DC

## 2014-07-01 MED ORDER — EPOETIN ALFA 10000 UNIT/ML IJ SOLN
INTRAMUSCULAR | Status: AC
  Administered 2014-07-01: 10000 [IU] via SUBCUTANEOUS
  Filled 2014-07-01: qty 1

## 2014-07-09 ENCOUNTER — Encounter (HOSPITAL_COMMUNITY)
Admission: RE | Admit: 2014-07-09 | Discharge: 2014-07-09 | Disposition: A | Discharge status: Home / Self Care | Payer: Medicare Other | Source: Ambulatory Visit | Attending: Nephrology | Admitting: Nephrology

## 2014-07-09 DIAGNOSIS — D638 Anemia in other chronic diseases classified elsewhere: Secondary | ICD-10-CM | POA: Diagnosis not present

## 2014-07-09 LAB — POCT HEMOGLOBIN-HEMACUE: Hemoglobin: 10.7 g/dL — ABNORMAL LOW (ref 12.0–15.0)

## 2014-07-09 MED ORDER — EPOETIN ALFA 10000 UNIT/ML IJ SOLN
INTRAMUSCULAR | Status: AC
  Administered 2014-07-09: 10000 [IU] via SUBCUTANEOUS
  Filled 2014-07-09: qty 1

## 2014-07-09 MED ORDER — EPOETIN ALFA 10000 UNIT/ML IJ SOLN: 5000.0000 [IU] | INTRAMUSCULAR | Status: DC

## 2014-07-16 ENCOUNTER — Encounter (HOSPITAL_COMMUNITY)
Admission: RE | Admit: 2014-07-16 | Discharge: 2014-07-16 | Disposition: A | Discharge status: Home / Self Care | Payer: Medicare Other | Source: Ambulatory Visit | Attending: Nephrology | Admitting: Nephrology

## 2014-07-16 DIAGNOSIS — D638 Anemia in other chronic diseases classified elsewhere: Secondary | ICD-10-CM | POA: Diagnosis not present

## 2014-07-16 LAB — POCT HEMOGLOBIN-HEMACUE: Hemoglobin: 11.5 g/dL — ABNORMAL LOW (ref 12.0–15.0)

## 2014-07-16 MED ORDER — EPOETIN ALFA 10000 UNIT/ML IJ SOLN
INTRAMUSCULAR | Status: AC
  Administered 2014-07-16: 5000 [IU] via SUBCUTANEOUS
  Filled 2014-07-16: qty 1

## 2014-07-16 MED ORDER — EPOETIN ALFA 10000 UNIT/ML IJ SOLN
5000.0000 [IU] | INTRAMUSCULAR | Status: DC
  Administered 2014-07-16: 5000 [IU] via SUBCUTANEOUS

## 2014-07-23 ENCOUNTER — Encounter (HOSPITAL_COMMUNITY)
Admission: RE | Admit: 2014-07-23 | Discharge: 2014-07-23 | Disposition: A | Discharge status: Home / Self Care | Payer: Medicare Other | Source: Ambulatory Visit | Attending: Nephrology | Admitting: Nephrology

## 2014-07-23 DIAGNOSIS — D638 Anemia in other chronic diseases classified elsewhere: Secondary | ICD-10-CM | POA: Diagnosis not present

## 2014-07-23 LAB — POCT HEMOGLOBIN-HEMACUE: Hemoglobin: 10.9 g/dL — ABNORMAL LOW (ref 12.0–15.0)

## 2014-07-23 MED ORDER — EPOETIN ALFA 10000 UNIT/ML IJ SOLN
INTRAMUSCULAR | Status: AC
Start: 2014-07-23 — End: 2014-07-23
  Filled 2014-07-23: qty 1

## 2014-07-23 MED ORDER — EPOETIN ALFA 10000 UNIT/ML IJ SOLN
5000.0000 [IU] | INTRAMUSCULAR | Status: DC
  Administered 2014-07-23: 09:00:00 via SUBCUTANEOUS

## 2014-07-30 ENCOUNTER — Encounter (HOSPITAL_COMMUNITY)
Admission: RE | Admit: 2014-07-30 | Discharge: 2014-07-30 | Disposition: A | Discharge status: Home / Self Care | Payer: Medicare Other | Source: Ambulatory Visit | Attending: Nephrology | Admitting: Nephrology

## 2014-07-30 DIAGNOSIS — D638 Anemia in other chronic diseases classified elsewhere: Secondary | ICD-10-CM | POA: Diagnosis not present

## 2014-07-30 LAB — POCT HEMOGLOBIN-HEMACUE: Hemoglobin: 10.6 g/dL — ABNORMAL LOW (ref 12.0–15.0)

## 2014-07-30 MED ORDER — EPOETIN ALFA 10000 UNIT/ML IJ SOLN
INTRAMUSCULAR | Status: AC
Start: 2014-07-30 — End: 2014-07-30
  Filled 2014-07-30: qty 1

## 2014-07-30 MED ORDER — EPOETIN ALFA 10000 UNIT/ML IJ SOLN
5000.0000 [IU] | INTRAMUSCULAR | Status: DC
  Administered 2014-07-30: 5000 [IU] via SUBCUTANEOUS

## 2014-08-06 ENCOUNTER — Encounter (HOSPITAL_COMMUNITY)
Admission: RE | Admit: 2014-08-06 | Discharge: 2014-08-06 | Disposition: A | Discharge status: Home / Self Care | Payer: Medicare Other | Source: Ambulatory Visit | Attending: Nephrology | Admitting: Nephrology

## 2014-08-06 DIAGNOSIS — D638 Anemia in other chronic diseases classified elsewhere: Secondary | ICD-10-CM | POA: Insufficient documentation

## 2014-08-06 DIAGNOSIS — N184 Chronic kidney disease, stage 4 (severe): Secondary | ICD-10-CM | POA: Insufficient documentation

## 2014-08-06 LAB — IRON AND TIBC
IRON: 43 ug/dL (ref 42–135)
Saturation Ratios: 17 % — ABNORMAL LOW (ref 20–55)
TIBC: 250 ug/dL (ref 250–470)
UIBC: 207 ug/dL (ref 125–400)

## 2014-08-06 LAB — POCT HEMOGLOBIN-HEMACUE: Hemoglobin: 11.8 g/dL — ABNORMAL LOW (ref 12.0–15.0)

## 2014-08-06 LAB — FERRITIN: Ferritin: 203 ng/mL (ref 10–291)

## 2014-08-06 MED ORDER — EPOETIN ALFA 10000 UNIT/ML IJ SOLN
INTRAMUSCULAR | Status: AC
  Filled 2014-08-06: qty 1

## 2014-08-06 MED ORDER — EPOETIN ALFA 10000 UNIT/ML IJ SOLN
5000.0000 [IU] | INTRAMUSCULAR | Status: DC
  Administered 2014-08-06: 5000 [IU] via SUBCUTANEOUS

## 2014-08-12 ENCOUNTER — Encounter (HOSPITAL_COMMUNITY)
Admission: RE | Admit: 2014-08-12 | Discharge: 2014-08-12 | Disposition: A | Discharge status: Home / Self Care | Payer: Medicare Other | Source: Ambulatory Visit | Attending: Nephrology | Admitting: Nephrology

## 2014-08-12 DIAGNOSIS — D638 Anemia in other chronic diseases classified elsewhere: Secondary | ICD-10-CM | POA: Diagnosis not present

## 2014-08-12 LAB — POCT HEMOGLOBIN-HEMACUE: Hemoglobin: 10.2 g/dL — ABNORMAL LOW (ref 12.0–15.0)

## 2014-08-12 MED ORDER — EPOETIN ALFA 10000 UNIT/ML IJ SOLN
INTRAMUSCULAR | Status: AC
  Administered 2014-08-12: 10000 [IU] via SUBCUTANEOUS
  Filled 2014-08-12: qty 1

## 2014-08-12 MED ORDER — EPOETIN ALFA 10000 UNIT/ML IJ SOLN: 5000.0000 [IU] | INTRAMUSCULAR | Status: DC

## 2014-08-20 ENCOUNTER — Encounter (HOSPITAL_COMMUNITY)
Admission: RE | Admit: 2014-08-20 | Discharge: 2014-08-20 | Disposition: A | Discharge status: Home / Self Care | Payer: Medicare Other | Source: Ambulatory Visit | Attending: Nephrology | Admitting: Nephrology

## 2014-08-20 DIAGNOSIS — D638 Anemia in other chronic diseases classified elsewhere: Secondary | ICD-10-CM | POA: Diagnosis not present

## 2014-08-20 LAB — POCT HEMOGLOBIN-HEMACUE: Hemoglobin: 10.3 g/dL — ABNORMAL LOW (ref 12.0–15.0)

## 2014-08-20 MED ORDER — EPOETIN ALFA 10000 UNIT/ML IJ SOLN: 5000.0000 [IU] | INTRAMUSCULAR | Status: DC

## 2014-08-20 MED ORDER — EPOETIN ALFA 10000 UNIT/ML IJ SOLN
INTRAMUSCULAR | Status: AC
  Administered 2014-08-20: 5000 [IU] via SUBCUTANEOUS
  Filled 2014-08-20: qty 1

## 2014-08-26 ENCOUNTER — Other Ambulatory Visit (HOSPITAL_COMMUNITY): Payer: Self-pay | Admitting: *Deleted

## 2014-08-27 ENCOUNTER — Encounter (HOSPITAL_COMMUNITY)
Admission: RE | Admit: 2014-08-27 | Discharge: 2014-08-27 | Disposition: A | Discharge status: Home / Self Care | Payer: Medicare Other | Source: Ambulatory Visit | Attending: Nephrology | Admitting: Nephrology

## 2014-08-27 ENCOUNTER — Other Ambulatory Visit (HOSPITAL_COMMUNITY): Payer: Self-pay | Admitting: Cardiology

## 2014-08-27 DIAGNOSIS — D638 Anemia in other chronic diseases classified elsewhere: Secondary | ICD-10-CM | POA: Diagnosis not present

## 2014-08-27 DIAGNOSIS — Z1231 Encounter for screening mammogram for malignant neoplasm of breast: Secondary | ICD-10-CM

## 2014-08-27 LAB — POCT HEMOGLOBIN-HEMACUE: Hemoglobin: 10.1 g/dL — ABNORMAL LOW (ref 12.0–15.0)

## 2014-08-27 MED ORDER — EPOETIN ALFA 10000 UNIT/ML IJ SOLN
5000.0000 [IU] | INTRAMUSCULAR | Status: DC
  Administered 2014-08-27: 5000 [IU] via SUBCUTANEOUS

## 2014-08-27 MED ORDER — EPOETIN ALFA 10000 UNIT/ML IJ SOLN
INTRAMUSCULAR | Status: AC
  Filled 2014-08-27: qty 1

## 2014-08-30 ENCOUNTER — Ambulatory Visit (HOSPITAL_COMMUNITY): Admission: RE | Admit: 2014-08-30 | Payer: Medicare Other | Source: Ambulatory Visit

## 2014-09-03 ENCOUNTER — Encounter (HOSPITAL_COMMUNITY)
Admission: RE | Admit: 2014-09-03 | Discharge: 2014-09-03 | Disposition: A | Discharge status: Home / Self Care | Payer: Medicare Other | Source: Ambulatory Visit | Attending: Nephrology | Admitting: Nephrology

## 2014-09-03 DIAGNOSIS — N184 Chronic kidney disease, stage 4 (severe): Secondary | ICD-10-CM | POA: Insufficient documentation

## 2014-09-03 DIAGNOSIS — D638 Anemia in other chronic diseases classified elsewhere: Secondary | ICD-10-CM | POA: Insufficient documentation

## 2014-09-03 LAB — POCT HEMOGLOBIN-HEMACUE: HEMOGLOBIN: 10.8 g/dL — AB (ref 12.0–15.0)

## 2014-09-03 LAB — IRON AND TIBC
Iron: 39 ug/dL — ABNORMAL LOW (ref 42–135)
Saturation Ratios: 15 % — ABNORMAL LOW (ref 20–55)
TIBC: 253 ug/dL (ref 250–470)
UIBC: 214 ug/dL (ref 125–400)

## 2014-09-03 LAB — FERRITIN: FERRITIN: 131 ng/mL (ref 10–291)

## 2014-09-03 MED ORDER — EPOETIN ALFA 10000 UNIT/ML IJ SOLN: 5000.0000 [IU] | INTRAMUSCULAR | Status: DC

## 2014-09-03 MED ORDER — EPOETIN ALFA 10000 UNIT/ML IJ SOLN
INTRAMUSCULAR | Status: AC
  Administered 2014-09-03: 5000 [IU] via SUBCUTANEOUS
  Filled 2014-09-03: qty 1

## 2014-09-10 ENCOUNTER — Encounter (HOSPITAL_COMMUNITY)
Admission: RE | Admit: 2014-09-10 | Discharge: 2014-09-10 | Disposition: A | Discharge status: Home / Self Care | Payer: Medicare Other | Source: Ambulatory Visit | Attending: Nephrology | Admitting: Nephrology

## 2014-09-10 DIAGNOSIS — D638 Anemia in other chronic diseases classified elsewhere: Secondary | ICD-10-CM | POA: Diagnosis not present

## 2014-09-10 LAB — POCT HEMOGLOBIN-HEMACUE: Hemoglobin: 10.6 g/dL — ABNORMAL LOW (ref 12.0–15.0)

## 2014-09-10 MED ORDER — EPOETIN ALFA 10000 UNIT/ML IJ SOLN
5000.0000 [IU] | INTRAMUSCULAR | Status: DC
  Administered 2014-09-10: 5000 [IU] via SUBCUTANEOUS

## 2014-09-10 MED ORDER — EPOETIN ALFA 10000 UNIT/ML IJ SOLN
INTRAMUSCULAR | Status: AC
  Administered 2014-09-10: 5000 [IU] via SUBCUTANEOUS
  Filled 2014-09-10: qty 1

## 2014-09-17 ENCOUNTER — Encounter (HOSPITAL_COMMUNITY): Payer: Medicare Other

## 2014-09-20 ENCOUNTER — Encounter (HOSPITAL_COMMUNITY)
Admission: RE | Admit: 2014-09-20 | Discharge: 2014-09-20 | Disposition: A | Discharge status: Home / Self Care | Payer: Medicare Other | Source: Ambulatory Visit | Attending: Nephrology | Admitting: Nephrology

## 2014-09-20 DIAGNOSIS — D638 Anemia in other chronic diseases classified elsewhere: Secondary | ICD-10-CM | POA: Diagnosis not present

## 2014-09-20 LAB — POCT HEMOGLOBIN-HEMACUE: Hemoglobin: 10.8 g/dL — ABNORMAL LOW (ref 12.0–15.0)

## 2014-09-20 MED ORDER — EPOETIN ALFA 10000 UNIT/ML IJ SOLN
5000.0000 [IU] | INTRAMUSCULAR | Status: DC
  Administered 2014-09-20: 5000 [IU] via SUBCUTANEOUS

## 2014-09-20 MED ORDER — EPOETIN ALFA 10000 UNIT/ML IJ SOLN
INTRAMUSCULAR | Status: AC
  Filled 2014-09-20: qty 1

## 2014-10-01 ENCOUNTER — Encounter (HOSPITAL_COMMUNITY)
Admission: RE | Admit: 2014-10-01 | Discharge: 2014-10-01 | Disposition: A | Discharge status: Home / Self Care | Payer: Medicare Other | Source: Ambulatory Visit | Attending: Nephrology | Admitting: Nephrology

## 2014-10-01 DIAGNOSIS — D631 Anemia in chronic kidney disease: Secondary | ICD-10-CM | POA: Insufficient documentation

## 2014-10-01 DIAGNOSIS — N184 Chronic kidney disease, stage 4 (severe): Secondary | ICD-10-CM | POA: Diagnosis present

## 2014-10-01 LAB — FERRITIN: Ferritin: 160 ng/mL (ref 10–291)

## 2014-10-01 LAB — IRON AND TIBC
Iron: 34 ug/dL — ABNORMAL LOW (ref 42–135)
Saturation Ratios: 14 % — ABNORMAL LOW (ref 20–55)
TIBC: 241 ug/dL — AB (ref 250–470)
UIBC: 207 ug/dL (ref 125–400)

## 2014-10-01 LAB — POCT HEMOGLOBIN-HEMACUE: HEMOGLOBIN: 11.6 g/dL — AB (ref 12.0–15.0)

## 2014-10-01 MED ORDER — EPOETIN ALFA 10000 UNIT/ML IJ SOLN
5000.0000 [IU] | INTRAMUSCULAR | Status: DC
  Administered 2014-10-01: 5000 [IU] via SUBCUTANEOUS

## 2014-10-01 MED ORDER — EPOETIN ALFA 10000 UNIT/ML IJ SOLN
INTRAMUSCULAR | Status: AC
  Filled 2014-10-01: qty 1

## 2014-10-08 ENCOUNTER — Encounter (HOSPITAL_COMMUNITY)
Admission: RE | Admit: 2014-10-08 | Discharge: 2014-10-08 | Disposition: A | Discharge status: Home / Self Care | Payer: Medicare Other | Source: Ambulatory Visit | Attending: Nephrology | Admitting: Nephrology

## 2014-10-08 DIAGNOSIS — D631 Anemia in chronic kidney disease: Secondary | ICD-10-CM | POA: Diagnosis not present

## 2014-10-08 LAB — POCT HEMOGLOBIN-HEMACUE: HEMOGLOBIN: 10.4 g/dL — AB (ref 12.0–15.0)

## 2014-10-08 MED ORDER — EPOETIN ALFA 10000 UNIT/ML IJ SOLN
5000.0000 [IU] | INTRAMUSCULAR | Status: DC
  Administered 2014-10-08: 5000 [IU] via SUBCUTANEOUS

## 2014-10-08 MED ORDER — EPOETIN ALFA 10000 UNIT/ML IJ SOLN
INTRAMUSCULAR | Status: AC
  Administered 2014-10-08: 5000 [IU] via SUBCUTANEOUS
  Filled 2014-10-08: qty 1

## 2014-10-15 ENCOUNTER — Encounter (HOSPITAL_COMMUNITY)
Admission: RE | Admit: 2014-10-15 | Discharge: 2014-10-15 | Disposition: A | Discharge status: Home / Self Care | Payer: Medicare Other | Source: Ambulatory Visit | Attending: Nephrology | Admitting: Nephrology

## 2014-10-15 ENCOUNTER — Ambulatory Visit (HOSPITAL_COMMUNITY)
Admission: RE | Admit: 2014-10-15 | Discharge: 2014-10-15 | Disposition: A | Discharge status: Home / Self Care | Payer: Medicare Other | Source: Ambulatory Visit | Attending: Cardiology | Admitting: Cardiology

## 2014-10-15 DIAGNOSIS — D631 Anemia in chronic kidney disease: Secondary | ICD-10-CM | POA: Diagnosis not present

## 2014-10-15 DIAGNOSIS — Z1231 Encounter for screening mammogram for malignant neoplasm of breast: Secondary | ICD-10-CM | POA: Diagnosis present

## 2014-10-15 LAB — POCT HEMOGLOBIN-HEMACUE: Hemoglobin: 10.1 g/dL — ABNORMAL LOW (ref 12.0–15.0)

## 2014-10-15 MED ORDER — EPOETIN ALFA 10000 UNIT/ML IJ SOLN
5000.0000 [IU] | INTRAMUSCULAR | Status: DC
  Administered 2014-10-15: 5000 [IU] via SUBCUTANEOUS

## 2014-10-15 MED ORDER — EPOETIN ALFA 10000 UNIT/ML IJ SOLN
INTRAMUSCULAR | Status: AC
  Filled 2014-10-15: qty 1

## 2014-10-22 ENCOUNTER — Encounter (HOSPITAL_COMMUNITY)
Admission: RE | Admit: 2014-10-22 | Discharge: 2014-10-22 | Disposition: A | Discharge status: Home / Self Care | Payer: Medicare Other | Source: Ambulatory Visit | Attending: Nephrology | Admitting: Nephrology

## 2014-10-22 DIAGNOSIS — D631 Anemia in chronic kidney disease: Secondary | ICD-10-CM | POA: Diagnosis not present

## 2014-10-22 LAB — POCT HEMOGLOBIN-HEMACUE: Hemoglobin: 10.1 g/dL — ABNORMAL LOW (ref 12.0–15.0)

## 2014-10-22 MED ORDER — EPOETIN ALFA 10000 UNIT/ML IJ SOLN
5000.0000 [IU] | INTRAMUSCULAR | Status: DC
  Administered 2014-10-22: 5000 [IU] via SUBCUTANEOUS

## 2014-10-22 MED ORDER — EPOETIN ALFA 10000 UNIT/ML IJ SOLN
INTRAMUSCULAR | Status: AC
  Filled 2014-10-22: qty 1

## 2014-10-29 ENCOUNTER — Encounter (HOSPITAL_COMMUNITY)
Admission: RE | Admit: 2014-10-29 | Discharge: 2014-10-29 | Disposition: A | Discharge status: Home / Self Care | Payer: Medicare Other | Source: Ambulatory Visit | Attending: Nephrology | Admitting: Nephrology

## 2014-10-29 DIAGNOSIS — D631 Anemia in chronic kidney disease: Secondary | ICD-10-CM | POA: Diagnosis not present

## 2014-10-29 LAB — POCT HEMOGLOBIN-HEMACUE: Hemoglobin: 11.5 g/dL — ABNORMAL LOW (ref 12.0–15.0)

## 2014-10-29 MED ORDER — EPOETIN ALFA 10000 UNIT/ML IJ SOLN
5000.0000 [IU] | INTRAMUSCULAR | Status: DC
  Administered 2014-10-29: 08:00:00 via SUBCUTANEOUS

## 2014-10-29 MED ORDER — EPOETIN ALFA 10000 UNIT/ML IJ SOLN
INTRAMUSCULAR | Status: AC
  Filled 2014-10-29: qty 1

## 2014-10-29 MED ORDER — ACETAMINOPHEN 500 MG PO TABS: 1000.0000 mg | ORAL_TABLET | Freq: Once | ORAL | Status: DC

## 2014-10-29 MED ORDER — SODIUM CHLORIDE 0.9 % IV SOLN: 3.0000 mg/kg | INTRAVENOUS | Status: DC

## 2014-10-29 MED ORDER — FAMOTIDINE 20 MG PO TABS: 20.0000 mg | ORAL_TABLET | Freq: Once | ORAL | Status: DC

## 2014-11-11 ENCOUNTER — Other Ambulatory Visit: Payer: Self-pay

## 2014-11-11 DIAGNOSIS — Z01810 Encounter for preprocedural cardiovascular examination: Secondary | ICD-10-CM

## 2014-11-11 DIAGNOSIS — N184 Chronic kidney disease, stage 4 (severe): Secondary | ICD-10-CM

## 2014-11-29 ENCOUNTER — Encounter: Payer: Self-pay | Admitting: Vascular Surgery

## 2014-11-30 ENCOUNTER — Ambulatory Visit (HOSPITAL_COMMUNITY)
Admission: RE | Admit: 2014-11-30 | Discharge: 2014-11-30 | Disposition: A | Discharge status: Home / Self Care | Payer: Medicare Other | Source: Ambulatory Visit | Attending: Vascular Surgery | Admitting: Vascular Surgery

## 2014-11-30 ENCOUNTER — Ambulatory Visit (INDEPENDENT_AMBULATORY_CARE_PROVIDER_SITE_OTHER): Payer: Medicare Other | Admitting: Vascular Surgery

## 2014-11-30 ENCOUNTER — Encounter: Payer: Self-pay | Admitting: Vascular Surgery

## 2014-11-30 ENCOUNTER — Ambulatory Visit (INDEPENDENT_AMBULATORY_CARE_PROVIDER_SITE_OTHER)
Admission: RE | Admit: 2014-11-30 | Discharge: 2014-11-30 | Disposition: A | Discharge status: Home / Self Care | Payer: Medicare Other | Source: Ambulatory Visit | Attending: Vascular Surgery | Admitting: Vascular Surgery

## 2014-11-30 VITALS — BP 157/72 | HR 68 | Resp 18 | Ht 63.0 in | Wt 172.0 lb

## 2014-11-30 DIAGNOSIS — N184 Chronic kidney disease, stage 4 (severe): Secondary | ICD-10-CM

## 2014-11-30 DIAGNOSIS — Z01810 Encounter for preprocedural cardiovascular examination: Secondary | ICD-10-CM | POA: Diagnosis not present

## 2014-11-30 DIAGNOSIS — N189 Chronic kidney disease, unspecified: Secondary | ICD-10-CM

## 2014-11-30 NOTE — Progress Notes (Signed)
Subjective:     Patient ID: [name], female   DOB: September 09, 1945, 69 y.o.   MRN: [id number]  HPI  This 69 year old female was referred by Dr. Edrick Oh for evaluation for vascular access. Patient has not yet started hemodialysis. She is right-handed. She does have diabetes mellitus area   Past Medical History  Diagnosis Date  . Diabetes mellitus   . Hypertension   . Arthritis   . Sleep apnea   . CKD (chronic kidney disease)   . CHF (congestive heart failure)   . Hypercholesteremia   . Diabetic neuropathy   . DJD (degenerative joint disease)   . Anemia     History  Substance Use Topics  . Smoking status: Former Research scientist (life sciences)  . Smokeless tobacco: Never Used  . Alcohol Use: No    Family History  Problem Relation Age of Onset  . Diabetes Brother     Allergies  Allergen Reactions  . Penicillins Anaphylaxis  . Lisinopril Cough    REACTION: Cough    Current outpatient prescriptions: amLODipine (NORVASC) 5 MG tablet, Take 10 mg by mouth daily., Disp: , Rfl: ;  atorvastatin (LIPITOR) 80 MG tablet, Take 40 mg by mouth daily., Disp: , Rfl: ;  furosemide (LASIX) 80 MG tablet, Take 1 tablet (80 mg total) by mouth 2 (two) times daily., Disp: 60 tablet, Rfl: 3;  glipiZIDE (GLUCOTROL XL) 5 MG 24 hr tablet, Take 1 tablet (5 mg total) by mouth 2 (two) times daily., Disp: 30 tablet, Rfl: 3 isosorbide-hydrALAZINE (BIDIL) 20-37.5 MG per tablet, Take 1 tablet by mouth 3 (three) times daily., Disp: 90 tablet, Rfl: 3;  LABETALOL HCL PO, Take 200 mg by mouth 3 (three) times daily., Disp: , Rfl: ;  pantoprazole (PROTONIX) 40 MG tablet, Take 1 tablet (40 mg total) by mouth 2 (two) times daily., Disp: 60 tablet, Rfl: 3  BP 157/72 mmHg  Pulse 68  Resp 18  Ht 5\' 3"  (1.6 m)  Wt 172 lb (78.019 kg)  BMI 30.48 kg/m2  Body mass index is 30.48 kg/(m^2).          Review of Systems Has occasional chest pain, dyspnea on exertion , leg pain with walking , wheezing. Other systems negative and a complete  review of systems. Denies previus myocardial infarction but does ave histor of congesive heart failure.     Objective:   Physical Exam BP 157/72 mmHg  Pulse 68  Resp 18  Ht 5\' 3"  (1.6 m)  Wt 172 lb (78.019 kg)  BMI 30.48 kg/m2  Gen.-alert and oriented x3 in no apparent distress HEENT normal for age Lungs no rhonchi or wheezing Cardiovascular regular rhythm no murmurs carotid pulses 3+ palpable no bruits audible Abdomen soft nontender no palpable masses Musculoskeletal free of  major deformities Skin clear -no rashes Neurologic normal Lower extremities 3+ femoral and dorsalis pedis pulses palpable bilaterally with no edema   today I  Ordered upper extremity arterial and vein mapping. It appears that the cephalic vein in the left upper arm i adequate for fistula creation. Arterial supply appears satisfactory in the radial artery the left wrist and at the brachial artery on the left side.       Assessment:      stage IV chronic renal insufficiency needs vascular access     Plan:      plan left brachial-cephalic AV fistula on Wednesday  December 9. Discussed with patient and she would like to proceed

## 2014-12-02 ENCOUNTER — Other Ambulatory Visit: Payer: Self-pay

## 2014-12-07 ENCOUNTER — Encounter (HOSPITAL_COMMUNITY): Payer: Self-pay | Admitting: *Deleted

## 2014-12-07 MED ORDER — CHLORHEXIDINE GLUCONATE 4 % EX LIQD
60.0000 mL | Freq: Once | CUTANEOUS | Status: DC
  Filled 2014-12-07: qty 60

## 2014-12-07 MED ORDER — SODIUM CHLORIDE 0.9 % IV SOLN
INTRAVENOUS | Status: DC
  Administered 2014-12-08: 08:00:00 via INTRAVENOUS

## 2014-12-07 MED ORDER — VANCOMYCIN HCL IN DEXTROSE 1-5 GM/200ML-% IV SOLN
1000.0000 mg | INTRAVENOUS | Status: AC
  Administered 2014-12-08: 1000 mg via INTRAVENOUS
  Filled 2014-12-07: qty 200

## 2014-12-07 NOTE — Progress Notes (Addendum)
Cardiologist is Dr Terrence Dupont , I faxed a request to his office requesting any cardiac studies and last office note,  I requested labs from [facility]. [name] reported that she went to a hospital in [place] for shortness of breath, Thanksgiving day. "They gave me a breathing treatment and I felt so much better."  "They said I need to get one, but I left me papers there, my daughter has them."  I asked patient if they recommended her to follow up with her doctor here and she did not know.  I asked patient about her blood sugars, she reported that she checks them sometimes, not necessarily in the mornings.  "My blood sugar runs real good, normal".. Did not give me a value even when I pressed.  Patient did not know which hospital.  I called patient daughter [name] and she said it was [facility].  I faxed a request asking for records.

## 2014-12-07 NOTE — Progress Notes (Signed)
Anesthesia Chart Review: Patient is a 69 year old female scheduled for creation of left brachial cephalic AVF on 78/67/54 by Dr. Kellie Simmering.  She is scheduled to be a same day work-up.  History includes former smoker, CKD stage IV not yet on dialysis, CHF with EF 30-35% by 09/2013 echo, DM2, HTN, OSA, exertional dyspnea, GERD, anemia, hypercholesterolemia, arthritis, right total shoulder. Referred to [facility] by nephrologist Dr. Justin Mend. Cardiologist is Dr. Terrence Dupont, last visit 11/12/14 (only partial records received).  Meds listed include amlodipine, atorvastatin, calcitriol, furosemide, glipizide, isosorbide-hydralazine, labetalol, Rena-Vit, Protonix.  Echo 10/23/13:  - Left ventricle: The cavity size was mildly dilated. Systolic function was moderately to severely reduced. The estimated ejection fraction was in the range of 30% to 35%. Diffuse hypokinesis. Regional wall motion abnormalities cannot be excluded. Doppler parameters are consistent with abnormal left ventricular relaxation (grade 1 diastolic dysfunction). - Ventricular septum: Septal motion showed abnormal function and dyssynergy. - Mitral valve: Mild regurgitation. - Atrial septum: No defect or patent foramen ovale was identified. - Pericardium, extracardiac: A small pericardial effusion was identified circumferential to the heart. There was no evidence of hemodynamic compromise.  Her last EKG in Epic is > 20 year old.  On 11/20/13 it showed SR with first degree AVB, LAD, left BBB. This was present since at least 09/2013--however, there are no prior EKG in Epic or Muse.  I reviewed above with anesthesiologist Dr. Marcie Bal who agreed that it would be best to communicate plans for surgery with Dr. Terrence Dupont to ensure no additional preoperative testing was felt indicated.  I called and faxed information to Dr. Terrence Dupont just after lunch today. I was just now able to speak with him.  He confirms that he does not see a stress  test done at his office either.  He did feel that as long as she was not having any acute cardiopulmonary symptoms on evaluation tomorrow, that she could tolerate hemodialysis access surgery.  Case is posted as MAC.  She will also get an ISTAT4 to ensure results are acceptable for OR. Further evaluation by her assigned anesthesiologist tomorrow.    [facility]/Anesthesiology Phone [phone] 12/07/2014 5:08 PM

## 2014-12-08 ENCOUNTER — Encounter (HOSPITAL_COMMUNITY): Payer: Self-pay | Admitting: *Deleted

## 2014-12-08 ENCOUNTER — Encounter (HOSPITAL_COMMUNITY)
Admission: RE | Disposition: A | Discharge status: Home / Self Care | Payer: Self-pay | Source: Ambulatory Visit | Attending: Vascular Surgery

## 2014-12-08 ENCOUNTER — Ambulatory Visit (HOSPITAL_COMMUNITY): Payer: Medicare Other | Admitting: Vascular Surgery

## 2014-12-08 ENCOUNTER — Other Ambulatory Visit: Payer: Self-pay | Admitting: *Deleted

## 2014-12-08 ENCOUNTER — Ambulatory Visit (HOSPITAL_COMMUNITY)
Admission: RE | Admit: 2014-12-08 | Discharge: 2014-12-08 | Disposition: A | Discharge status: Home / Self Care | Payer: Medicare Other | Source: Ambulatory Visit | Attending: Vascular Surgery | Admitting: Vascular Surgery

## 2014-12-08 ENCOUNTER — Telehealth: Payer: Self-pay | Admitting: Vascular Surgery

## 2014-12-08 DIAGNOSIS — E1140 Type 2 diabetes mellitus with diabetic neuropathy, unspecified: Secondary | ICD-10-CM | POA: Insufficient documentation

## 2014-12-08 DIAGNOSIS — Z87891 Personal history of nicotine dependence: Secondary | ICD-10-CM | POA: Insufficient documentation

## 2014-12-08 DIAGNOSIS — I509 Heart failure, unspecified: Secondary | ICD-10-CM | POA: Diagnosis not present

## 2014-12-08 DIAGNOSIS — I120 Hypertensive chronic kidney disease with stage 5 chronic kidney disease or end stage renal disease: Secondary | ICD-10-CM | POA: Insufficient documentation

## 2014-12-08 DIAGNOSIS — Z79899 Other long term (current) drug therapy: Secondary | ICD-10-CM | POA: Diagnosis not present

## 2014-12-08 DIAGNOSIS — M1990 Unspecified osteoarthritis, unspecified site: Secondary | ICD-10-CM | POA: Diagnosis not present

## 2014-12-08 DIAGNOSIS — N186 End stage renal disease: Secondary | ICD-10-CM | POA: Diagnosis not present

## 2014-12-08 DIAGNOSIS — Z992 Dependence on renal dialysis: Secondary | ICD-10-CM | POA: Insufficient documentation

## 2014-12-08 DIAGNOSIS — G4730 Sleep apnea, unspecified: Secondary | ICD-10-CM | POA: Insufficient documentation

## 2014-12-08 DIAGNOSIS — Z888 Allergy status to other drugs, medicaments and biological substances status: Secondary | ICD-10-CM | POA: Diagnosis not present

## 2014-12-08 DIAGNOSIS — Z4931 Encounter for adequacy testing for hemodialysis: Secondary | ICD-10-CM

## 2014-12-08 DIAGNOSIS — Z880 Allergy status to penicillin: Secondary | ICD-10-CM | POA: Diagnosis not present

## 2014-12-08 DIAGNOSIS — D649 Anemia, unspecified: Secondary | ICD-10-CM | POA: Insufficient documentation

## 2014-12-08 DIAGNOSIS — E780 Pure hypercholesterolemia: Secondary | ICD-10-CM | POA: Diagnosis not present

## 2014-12-08 HISTORY — PX: AV FISTULA PLACEMENT: SHX1204

## 2014-12-08 HISTORY — DX: Reserved for inherently not codable concepts without codable children: IMO0001

## 2014-12-08 HISTORY — DX: Gastro-esophageal reflux disease without esophagitis: K21.9

## 2014-12-08 HISTORY — DX: Personal history of other medical treatment: Z92.89

## 2014-12-08 LAB — POCT I-STAT 4, (NA,K, GLUC, HGB,HCT)
Glucose, Bld: 112 mg/dL — ABNORMAL HIGH (ref 70–99)
HEMATOCRIT: 27 % — AB (ref 36.0–46.0)
Hemoglobin: 9.2 g/dL — ABNORMAL LOW (ref 12.0–15.0)
POTASSIUM: 3.5 meq/L — AB (ref 3.7–5.3)
Sodium: 140 mEq/L (ref 137–147)

## 2014-12-08 LAB — GLUCOSE, CAPILLARY: Glucose-Capillary: 126 mg/dL — ABNORMAL HIGH (ref 70–99)

## 2014-12-08 SURGERY — ARTERIOVENOUS (AV) FISTULA CREATION
Anesthesia: Monitor Anesthesia Care | Site: Arm Upper | Laterality: Left

## 2014-12-08 MED ORDER — OXYCODONE HCL 5 MG/5ML PO SOLN: 5.0000 mg | Freq: Once | ORAL | Status: DC | PRN

## 2014-12-08 MED ORDER — PROPOFOL INFUSION 10 MG/ML OPTIME
INTRAVENOUS | Status: DC | PRN
  Administered 2014-12-08: 25 ug/kg/min via INTRAVENOUS

## 2014-12-08 MED ORDER — FENTANYL CITRATE 0.05 MG/ML IJ SOLN
25.0000 ug | INTRAMUSCULAR | Status: DC | PRN
  Administered 2014-12-08: 25 ug via INTRAVENOUS

## 2014-12-08 MED ORDER — EPHEDRINE SULFATE 50 MG/ML IJ SOLN
INTRAMUSCULAR | Status: AC
  Filled 2014-12-08: qty 1

## 2014-12-08 MED ORDER — LIDOCAINE HCL (CARDIAC) 20 MG/ML IV SOLN
INTRAVENOUS | Status: AC
  Filled 2014-12-08: qty 5

## 2014-12-08 MED ORDER — MIDAZOLAM HCL 2 MG/2ML IJ SOLN
INTRAMUSCULAR | Status: AC
  Filled 2014-12-08: qty 2

## 2014-12-08 MED ORDER — SUCCINYLCHOLINE CHLORIDE 20 MG/ML IJ SOLN
INTRAMUSCULAR | Status: AC
  Filled 2014-12-08: qty 1

## 2014-12-08 MED ORDER — ONDANSETRON HCL 4 MG/2ML IJ SOLN
INTRAMUSCULAR | Status: AC
  Filled 2014-12-08: qty 2

## 2014-12-08 MED ORDER — FENTANYL CITRATE 0.05 MG/ML IJ SOLN
INTRAMUSCULAR | Status: DC | PRN
  Administered 2014-12-08 (×2): 25 ug via INTRAVENOUS

## 2014-12-08 MED ORDER — ONDANSETRON HCL 4 MG/2ML IJ SOLN: 4.0000 mg | Freq: Once | INTRAMUSCULAR | Status: DC | PRN

## 2014-12-08 MED ORDER — MIDAZOLAM HCL 5 MG/5ML IJ SOLN
INTRAMUSCULAR | Status: DC | PRN
Start: 2014-12-08 — End: 2014-12-08
  Administered 2014-12-08: 2 mg via INTRAVENOUS

## 2014-12-08 MED ORDER — PROPOFOL 10 MG/ML IV BOLUS
INTRAVENOUS | Status: AC
  Filled 2014-12-08: qty 20

## 2014-12-08 MED ORDER — OXYCODONE HCL 5 MG PO TABS: 5.0000 mg | ORAL_TABLET | Freq: Once | ORAL | Status: DC | PRN

## 2014-12-08 MED ORDER — SODIUM CHLORIDE 0.9 % IJ SOLN
INTRAMUSCULAR | Status: AC
  Filled 2014-12-08: qty 10

## 2014-12-08 MED ORDER — 0.9 % SODIUM CHLORIDE (POUR BTL) OPTIME
TOPICAL | Status: DC | PRN
  Administered 2014-12-08: 1000 mL

## 2014-12-08 MED ORDER — OXYCODONE-ACETAMINOPHEN 5-325 MG PO TABS: 1.0000 | ORAL_TABLET | Freq: Four times a day (QID) | ORAL | Status: AC | PRN

## 2014-12-08 MED ORDER — LIDOCAINE-EPINEPHRINE (PF) 1 %-1:200000 IJ SOLN
INTRAMUSCULAR | Status: AC
  Filled 2014-12-08: qty 10

## 2014-12-08 MED ORDER — SODIUM CHLORIDE 0.9 % IV SOLN
INTRAVENOUS | Status: DC | PRN
  Administered 2014-12-08: 08:00:00 via INTRAVENOUS

## 2014-12-08 MED ORDER — SODIUM CHLORIDE 0.9 % IR SOLN
Status: DC | PRN
  Administered 2014-12-08: 09:00:00

## 2014-12-08 MED ORDER — ROCURONIUM BROMIDE 50 MG/5ML IV SOLN
INTRAVENOUS | Status: AC
  Filled 2014-12-08: qty 1

## 2014-12-08 MED ORDER — ARTIFICIAL TEARS OP OINT
TOPICAL_OINTMENT | OPHTHALMIC | Status: AC
  Filled 2014-12-08: qty 3.5

## 2014-12-08 MED ORDER — FENTANYL CITRATE 0.05 MG/ML IJ SOLN
INTRAMUSCULAR | Status: AC
  Administered 2014-12-08: 25 ug via INTRAVENOUS
  Filled 2014-12-08: qty 2

## 2014-12-08 MED ORDER — FENTANYL CITRATE 0.05 MG/ML IJ SOLN
INTRAMUSCULAR | Status: AC
  Filled 2014-12-08: qty 5

## 2014-12-08 MED ORDER — LIDOCAINE-EPINEPHRINE (PF) 1 %-1:200000 IJ SOLN
INTRAMUSCULAR | Status: DC | PRN
  Administered 2014-12-08: 30 mL

## 2014-12-08 SURGICAL SUPPLY — 36 items
ARMBAND PINK RESTRICT EXTREMIT (MISCELLANEOUS) ×3 IMPLANT
BLADE SURG 10 STRL SS (BLADE) ×3 IMPLANT
CANISTER SUCTION 2500CC (MISCELLANEOUS) ×3 IMPLANT
CLIP TI MEDIUM 6 (CLIP) ×3 IMPLANT
CLIP TI WIDE RED SMALL 6 (CLIP) ×3 IMPLANT
COVER PROBE W GEL 5X96 (DRAPES) ×3 IMPLANT
COVER SURGICAL LIGHT HANDLE (MISCELLANEOUS) ×3 IMPLANT
DRAIN PENROSE 1/4X12 LTX STRL (WOUND CARE) ×3 IMPLANT
ELECT REM PT RETURN 9FT ADLT (ELECTROSURGICAL) ×3
ELECTRODE REM PT RTRN 9FT ADLT (ELECTROSURGICAL) ×1 IMPLANT
GEL ULTRASOUND 20GR AQUASONIC (MISCELLANEOUS) IMPLANT
GLOVE BIOGEL PI IND STRL 6.5 (GLOVE) ×1 IMPLANT
GLOVE BIOGEL PI IND STRL 7.0 (GLOVE) ×1 IMPLANT
GLOVE BIOGEL PI IND STRL 7.5 (GLOVE) ×1 IMPLANT
GLOVE BIOGEL PI INDICATOR 6.5 (GLOVE) ×2
GLOVE BIOGEL PI INDICATOR 7.0 (GLOVE) ×2
GLOVE BIOGEL PI INDICATOR 7.5 (GLOVE) ×2
GLOVE ECLIPSE 7.0 STRL STRAW (GLOVE) ×3 IMPLANT
GLOVE SS BIOGEL STRL SZ 7 (GLOVE) ×1 IMPLANT
GLOVE SUPERSENSE BIOGEL SZ 7 (GLOVE) ×2
GLOVE SURG SS PI 7.0 STRL IVOR (GLOVE) ×3 IMPLANT
GOWN STRL REUS W/ TWL LRG LVL3 (GOWN DISPOSABLE) ×3 IMPLANT
GOWN STRL REUS W/TWL LRG LVL3 (GOWN DISPOSABLE) ×9
KIT BASIN OR (CUSTOM PROCEDURE TRAY) ×3 IMPLANT
KIT ROOM TURNOVER OR (KITS) ×3 IMPLANT
LIQUID BAND (GAUZE/BANDAGES/DRESSINGS) ×3 IMPLANT
NS IRRIG 1000ML POUR BTL (IV SOLUTION) ×3 IMPLANT
PACK CV ACCESS (CUSTOM PROCEDURE TRAY) ×3 IMPLANT
PAD ARMBOARD 7.5X6 YLW CONV (MISCELLANEOUS) ×6 IMPLANT
PROBE PENCIL 8 MHZ STRL DISP (MISCELLANEOUS) IMPLANT
SUT PROLENE 6 0 BV (SUTURE) ×3 IMPLANT
SUT VIC AB 3-0 SH 27 (SUTURE) ×3
SUT VIC AB 3-0 SH 27X BRD (SUTURE) ×1 IMPLANT
SUT VICRYL 4-0 PS2 18IN ABS (SUTURE) ×3 IMPLANT
UNDERPAD 30X30 INCONTINENT (UNDERPADS AND DIAPERS) ×3 IMPLANT
WATER STERILE IRR 1000ML POUR (IV SOLUTION) ×3 IMPLANT

## 2014-12-08 NOTE — Op Note (Signed)
OPERATIVE REPORT  Date of Surgery: 12/08/2014  Surgeon: Tinnie Gens, MD  Assistant: Gerri Lins PA  Pre-op Diagnosis: ESRD  N18.6  Post-op Diagnosis: ESRD  N18.6  Procedure: Procedure(s): ARTERIOVENOUS (AV) FISTULA CREATION-LEFT BRACHIAL-CEPHALIC  Anesthesia: Mac  EBL: Minimal  Complications: None  Procedure Details: The patient was taken the operating room placed in supine position at which time left upper extremity was prepped with Betadine scrub and solution draped in routine sterile manner. After infiltration of 1% Xylocaine with epinephrine a short transverse incision was made and acute area antecubital vein dissected free. Cephalic branch was about 3 4 mm in size was of good quality. It was dissected free proximally and distally ligated distally transected gently dilated with heparinized saline marked for orientation purposes. Brachial artery was exposed beneath the fashion circled with Vesseloops. It was of 3 and 4:30 millimeter vessel of good pulse. It was an occluded proximally and distally with Vesseloops open 15 blade extended with Potts scissors had excellent inflow. Vein was carefully measured spatulated and anastomosed end to side with 6-0 Prolene. Vessel loops were released there with neck pulse and palpable thrill in the fistula. I then looked at the vein with the sono site searching for large competing branches. There was one small to moderate size branch in the mid upper arm. Did make a small incision in this area after infiltration with 1% Xylocaine with epinephrine but was unable to locate any significant competing branches. His wounds are then closed in layers with Vicryl in subcuticular fashion with Dermabond patient taken to recovery room in satisfactory condition   Tinnie Gens, MD 12/08/2014 9:43 AM

## 2014-12-08 NOTE — Telephone Encounter (Signed)
-----   Message from Mena Goes, RN sent at 12/08/2014  9:56 AM EST ----- Regarding: Schedule   ----- Message -----    From: Ulyses Amor, PA-C    Sent: 12/08/2014   9:37 AM      To: [facility] Charge Pool  F/U in 6 weeks s/p av fistula needs duplex of fistula Dr. Kellie Simmering

## 2014-12-08 NOTE — Telephone Encounter (Signed)
Spoke with patient to schedule appts, dpm

## 2014-12-08 NOTE — H&P (View-Only) (Signed)
Subjective:     Patient ID: [name], female   DOB: 1945/05/17, 69 y.o.   MRN: [id number]  HPI  This 69 year old female was referred by Dr. Edrick Oh for evaluation for vascular access. Patient has not yet started hemodialysis. She is right-handed. She does have diabetes mellitus area   Past Medical History  Diagnosis Date  . Diabetes mellitus   . Hypertension   . Arthritis   . Sleep apnea   . CKD (chronic kidney disease)   . CHF (congestive heart failure)   . Hypercholesteremia   . Diabetic neuropathy   . DJD (degenerative joint disease)   . Anemia     History  Substance Use Topics  . Smoking status: Former Research scientist (life sciences)  . Smokeless tobacco: Never Used  . Alcohol Use: No    Family History  Problem Relation Age of Onset  . Diabetes Brother     Allergies  Allergen Reactions  . Penicillins Anaphylaxis  . Lisinopril Cough    REACTION: Cough    Current outpatient prescriptions: amLODipine (NORVASC) 5 MG tablet, Take 10 mg by mouth daily., Disp: , Rfl: ;  atorvastatin (LIPITOR) 80 MG tablet, Take 40 mg by mouth daily., Disp: , Rfl: ;  furosemide (LASIX) 80 MG tablet, Take 1 tablet (80 mg total) by mouth 2 (two) times daily., Disp: 60 tablet, Rfl: 3;  glipiZIDE (GLUCOTROL XL) 5 MG 24 hr tablet, Take 1 tablet (5 mg total) by mouth 2 (two) times daily., Disp: 30 tablet, Rfl: 3 isosorbide-hydrALAZINE (BIDIL) 20-37.5 MG per tablet, Take 1 tablet by mouth 3 (three) times daily., Disp: 90 tablet, Rfl: 3;  LABETALOL HCL PO, Take 200 mg by mouth 3 (three) times daily., Disp: , Rfl: ;  pantoprazole (PROTONIX) 40 MG tablet, Take 1 tablet (40 mg total) by mouth 2 (two) times daily., Disp: 60 tablet, Rfl: 3  BP 157/72 mmHg  Pulse 68  Resp 18  Ht 5\' 3"  (1.6 m)  Wt 172 lb (78.019 kg)  BMI 30.48 kg/m2  Body mass index is 30.48 kg/(m^2).          Review of Systems Has occasional chest pain, dyspnea on exertion , leg pain with walking , wheezing. Other systems negative and a complete  review of systems. Denies previus myocardial infarction but does ave histor of congesive heart failure.     Objective:   Physical Exam BP 157/72 mmHg  Pulse 68  Resp 18  Ht 5\' 3"  (1.6 m)  Wt 172 lb (78.019 kg)  BMI 30.48 kg/m2  Gen.-alert and oriented x3 in no apparent distress HEENT normal for age Lungs no rhonchi or wheezing Cardiovascular regular rhythm no murmurs carotid pulses 3+ palpable no bruits audible Abdomen soft nontender no palpable masses Musculoskeletal free of  major deformities Skin clear -no rashes Neurologic normal Lower extremities 3+ femoral and dorsalis pedis pulses palpable bilaterally with no edema   today I  Ordered upper extremity arterial and vein mapping. It appears that the cephalic vein in the left upper arm i adequate for fistula creation. Arterial supply appears satisfactory in the radial artery the left wrist and at the brachial artery on the left side.       Assessment:      stage IV chronic renal insufficiency needs vascular access     Plan:      plan left brachial-cephalic AV fistula on Wednesday  December 9. Discussed with patient and she would like to proceed

## 2014-12-08 NOTE — Discharge Instructions (Signed)
What to eat: ° °For your first meals, you should eat lightly; only small meals initially.  If you do not have nausea, you may eat larger meals.  Avoid spicy, greasy and heavy food.   ° °General Anesthesia, Adult, Care After  °Refer to this sheet in the next few weeks. These instructions provide you with information on caring for yourself after your procedure. Your health care provider may also give you more specific instructions. Your treatment has been planned according to current medical practices, but problems sometimes occur. Call your health care provider if you have any problems or questions after your procedure.  °WHAT TO EXPECT AFTER THE PROCEDURE  °After the procedure, it is typical to experience:  °Sleepiness.  °Nausea and vomiting. °HOME CARE INSTRUCTIONS  °For the first 24 hours after general anesthesia:  °Have a responsible person with you.  °Do not drive a car. If you are alone, do not take public transportation.  °Do not drink alcohol.  °Do not take medicine that has not been prescribed by your health care provider.  °Do not sign important papers or make important decisions.  °You may resume a normal diet and activities as directed by your health care provider.  °Change bandages (dressings) as directed.  °If you have questions or problems that seem related to general anesthesia, call the hospital and ask for the anesthetist or anesthesiologist on call. °SEEK MEDICAL CARE IF:  °You have nausea and vomiting that continue the day after anesthesia.  °You develop a rash. °SEEK IMMEDIATE MEDICAL CARE IF:  °You have difficulty breathing.  °You have chest pain.  °You have any allergic problems. °Document Released: 03/25/2001 Document Revised: 08/19/2013 Document Reviewed: 07/02/2013  °ExitCare® Patient Information ©2014 [employer].  ° °Tissue Adhesive Wound Care  ° ° °Some cuts and wounds can be closed with tissue adhesive. Adhesive is like glue. It holds the skin together and helps a wound heal faster.  This adhesive goes away on its own as the wound heals.  °HOME CARE  °Showers are allowed. Do not soak the wound in water. Do not take baths, swim, or use hot tubs. Do not use soaps or creams on your wound.  °If a bandage (dressing) was put on, change it as often as told by your doctor.  °Keep the bandage dry.  °Do not scratch, pick, or rub the adhesive.  °Do not put tape over the adhesive. The adhesive could come off.  °Protect the wound from another injury.  °Protect the wound from sun and tanning beds.  °Only take medicine as told by your doctor.  °Keep all doctor visits as told. °GET HELP RIGHT AWAY IF:  °Your wound is red, puffy (swollen), hot, or tender.  °You get a rash after the glue is put on.  °You have more pain in the wound.  °You have a red streak going away from the wound.  °You have yellowish-white fluid (pus) coming from the wound.  °You have more bleeding.  °You have a fever.  °You have chills and start to shake.  °You notice a bad smell coming from the wound.  °Your wound or adhesive breaks open. °MAKE SURE YOU:  °Understand these instructions.  °Will watch your condition.  °Will get help right away if you are not doing well or get worse. °Document Released: 09/25/2008 Document Revised: 10/07/2013 Document Reviewed: 07/08/2013  °ExitCare® Patient Information ©2015 [employer]. This information is not intended to replace advice given to you by your health care provider.   Make sure you discuss any questions you have with your health care provider.  ° ° °

## 2014-12-08 NOTE — Transfer of Care (Signed)
Immediate Anesthesia Transfer of Care Note  Patient: [name]  Procedure(s) Performed: Procedure(s): ARTERIOVENOUS (AV) FISTULA CREATION-LEFT BRACHIAL-CEPHALIC (Left)  Patient Location: PACU  Anesthesia Type:MAC  Level of Consciousness: awake, alert  and oriented  Airway & Oxygen Therapy: Patient Spontanous Breathing and Patient connected to nasal cannula oxygen  Post-op Assessment: Report given to PACU RN, Post -op Vital signs reviewed and stable and Patient moving all extremities X 4  Post vital signs: Reviewed and stable  Complications: No apparent anesthesia complications

## 2014-12-08 NOTE — Addendum Note (Signed)
Addendum  created 12/08/14 1108 by Tiajuana Amass, MD   Modules edited: Anesthesia Responsible Staff

## 2014-12-08 NOTE — Anesthesia Postprocedure Evaluation (Signed)
  Anesthesia Post-op Note  Patient: [name]  Procedure(s) Performed: Procedure(s): ARTERIOVENOUS (AV) FISTULA CREATION-LEFT BRACHIAL-CEPHALIC (Left)  Patient Location: PACU  Anesthesia Type:MAC  Level of Consciousness: awake and alert   Airway and Oxygen Therapy: Patient Spontanous Breathing  Post-op Pain: none  Post-op Assessment: Post-op Vital signs reviewed  Post-op Vital Signs: Reviewed  Last Vitals:  Filed Vitals:   12/08/14 1045  BP: 167/67  Pulse: 65  Temp: 36.7 C  Resp: 20    Complications: No apparent anesthesia complications

## 2014-12-08 NOTE — Anesthesia Preprocedure Evaluation (Addendum)
Anesthesia Evaluation  Patient identified by MRN, date of birth, ID band Patient awake    Reviewed: Allergy & Precautions, H&P , NPO status , Patient's Chart, lab work & pertinent test results  Airway Mallampati: II  TM Distance: >3 FB Neck ROM: Full    Dental  (+) Chipped, Dental Advidsory Given   Pulmonary sleep apnea , former smoker,          Cardiovascular hypertension, +CHF     Neuro/Psych negative neurological ROS  negative psych ROS   GI/Hepatic Neg liver ROS, GERD-  ,  Endo/Other  diabetes, Type 2, Oral Hypoglycemic Agents  Renal/GU ESRFRenal disease     Musculoskeletal  (+) Arthritis -,   Abdominal   Peds  Hematology  (+) anemia ,   Anesthesia Other Findings   Reproductive/Obstetrics                           Anesthesia Physical Anesthesia Plan  ASA: III  Anesthesia Plan: MAC   Post-op Pain Management:    Induction: Intravenous  Airway Management Planned: Natural Airway and Simple Face Mask  Additional Equipment:   Intra-op Plan:   Post-operative Plan:   Informed Consent: I have reviewed the patients History and Physical, chart, labs and discussed the procedure including the risks, benefits and alternatives for the proposed anesthesia with the patient or authorized representative who has indicated his/her understanding and acceptance.   Dental advisory given and Dental Advisory Given  Plan Discussed with: CRNA, Surgeon and Anesthesiologist  Anesthesia Plan Comments:        Anesthesia Quick Evaluation

## 2014-12-08 NOTE — Interval H&P Note (Signed)
History and Physical Interval Note:  12/08/2014 8:19 AM  [name]  has presented today for surgery, with the diagnosis of ESRD  N18.6  The various methods of treatment have been discussed with the patient and family. After consideration of risks, benefits and other options for treatment, the patient has consented to  Procedure(s): ARTERIOVENOUS (AV) FISTULA CREATION-LEFT BRACHIAL-CEPHALIC (Left) as a surgical intervention .  The patient's history has been reviewed, patient examined, no change in status, stable for surgery.  I have reviewed the patient's chart and labs.  Questions were answered to the patient's satisfaction.     Tinnie Gens

## 2014-12-10 ENCOUNTER — Encounter (HOSPITAL_COMMUNITY): Payer: Self-pay | Admitting: Vascular Surgery

## 2015-01-18 ENCOUNTER — Other Ambulatory Visit (HOSPITAL_COMMUNITY): Payer: Medicare Other

## 2015-01-24 ENCOUNTER — Encounter: Payer: Self-pay | Admitting: Vascular Surgery

## 2015-01-25 ENCOUNTER — Encounter: Payer: Medicare Other | Admitting: Vascular Surgery

## 2015-03-31 ENCOUNTER — Telehealth: Payer: Self-pay | Admitting: Vascular Surgery

## 2015-03-31 NOTE — Telephone Encounter (Signed)
Mackenzee's sister in law, Rosalie Gums, called to let us know that Skarlett passed away on 2015/03/04. Patient was under the care of Dr. Kellie Simmering and she doesn't has an appointment coming up.

## 2015-04-01 DEATH — deceased
